# Patient Record
Sex: Female | Born: 1980 | Race: Black or African American | Hispanic: No | Marital: Single | Smoking: Current every day smoker
Health system: Southern US, Community
[De-identification: ages and names within clinical notes are randomized; demographics above are authoritative.]

## PROBLEM LIST (undated history)

## (undated) DIAGNOSIS — Z789 Other specified health status: Secondary | ICD-10-CM

## (undated) DIAGNOSIS — D509 Iron deficiency anemia, unspecified: Secondary | ICD-10-CM

## (undated) DIAGNOSIS — Z87442 Personal history of urinary calculi: Secondary | ICD-10-CM

## (undated) HISTORY — PX: NO PAST SURGERIES: SHX2092

---
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
## 2001-07-07 ENCOUNTER — Inpatient Hospital Stay (HOSPITAL_COMMUNITY): Admission: AD | Admit: 2001-07-07 | Discharge: 2001-07-07 | Payer: Self-pay | Admitting: *Deleted

## 2001-07-09 ENCOUNTER — Inpatient Hospital Stay (HOSPITAL_COMMUNITY): Admission: AD | Admit: 2001-07-09 | Discharge: 2001-07-09 | Payer: Self-pay | Admitting: *Deleted

## 2001-12-20 ENCOUNTER — Emergency Department (HOSPITAL_COMMUNITY): Admission: EM | Admit: 2001-12-20 | Discharge: 2001-12-20 | Payer: Self-pay | Admitting: Emergency Medicine

## 2002-10-19 ENCOUNTER — Inpatient Hospital Stay (HOSPITAL_COMMUNITY): Admission: AD | Admit: 2002-10-19 | Discharge: 2002-10-19 | Payer: Self-pay | Admitting: Obstetrics

## 2002-12-22 ENCOUNTER — Inpatient Hospital Stay (HOSPITAL_COMMUNITY): Admission: AD | Admit: 2002-12-22 | Discharge: 2002-12-22 | Payer: Self-pay | Admitting: Obstetrics

## 2003-08-08 ENCOUNTER — Inpatient Hospital Stay (HOSPITAL_COMMUNITY): Admission: AD | Admit: 2003-08-08 | Discharge: 2003-08-08 | Payer: Self-pay | Admitting: Obstetrics and Gynecology

## 2003-12-11 ENCOUNTER — Emergency Department (HOSPITAL_COMMUNITY): Admission: EM | Admit: 2003-12-11 | Discharge: 2003-12-11 | Payer: Self-pay | Admitting: Emergency Medicine

## 2005-01-12 ENCOUNTER — Inpatient Hospital Stay (HOSPITAL_COMMUNITY): Admission: AD | Admit: 2005-01-12 | Discharge: 2005-01-12 | Payer: Self-pay | Admitting: Obstetrics

## 2005-06-27 ENCOUNTER — Inpatient Hospital Stay (HOSPITAL_COMMUNITY): Admission: AD | Admit: 2005-06-27 | Discharge: 2005-06-27 | Payer: Self-pay | Admitting: Obstetrics

## 2005-10-29 ENCOUNTER — Emergency Department (HOSPITAL_COMMUNITY): Admission: EM | Admit: 2005-10-29 | Discharge: 2005-10-29 | Payer: Self-pay | Admitting: Emergency Medicine

## 2005-10-31 ENCOUNTER — Emergency Department (HOSPITAL_COMMUNITY): Admission: EM | Admit: 2005-10-31 | Discharge: 2005-10-31 | Payer: Self-pay | Admitting: Emergency Medicine

## 2006-08-14 ENCOUNTER — Emergency Department (HOSPITAL_COMMUNITY): Admission: EM | Admit: 2006-08-14 | Discharge: 2006-08-15 | Payer: Self-pay | Admitting: Emergency Medicine

## 2006-08-17 ENCOUNTER — Emergency Department (HOSPITAL_COMMUNITY): Admission: EM | Admit: 2006-08-17 | Discharge: 2006-08-17 | Payer: Self-pay | Admitting: Emergency Medicine

## 2006-09-10 ENCOUNTER — Emergency Department (HOSPITAL_COMMUNITY): Admission: EM | Admit: 2006-09-10 | Discharge: 2006-09-10 | Payer: Self-pay | Admitting: Emergency Medicine

## 2006-11-29 ENCOUNTER — Emergency Department (HOSPITAL_COMMUNITY): Admission: EM | Admit: 2006-11-29 | Discharge: 2006-11-29 | Payer: Self-pay | Admitting: *Deleted

## 2008-01-26 ENCOUNTER — Emergency Department (HOSPITAL_COMMUNITY): Admission: EM | Admit: 2008-01-26 | Discharge: 2008-01-27 | Payer: Self-pay | Admitting: Emergency Medicine

## 2010-11-02 ENCOUNTER — Emergency Department (HOSPITAL_COMMUNITY)
Admission: EM | Admit: 2010-11-02 | Discharge: 2010-11-03 | Disposition: A | Discharge status: Home / Self Care | Payer: No Typology Code available for payment source | Attending: Emergency Medicine | Admitting: Emergency Medicine

## 2010-11-02 ENCOUNTER — Emergency Department (HOSPITAL_COMMUNITY): Payer: No Typology Code available for payment source

## 2010-11-02 DIAGNOSIS — T148XXA Other injury of unspecified body region, initial encounter: Secondary | ICD-10-CM | POA: Insufficient documentation

## 2010-11-02 DIAGNOSIS — M542 Cervicalgia: Secondary | ICD-10-CM | POA: Insufficient documentation

## 2010-11-02 DIAGNOSIS — M25519 Pain in unspecified shoulder: Secondary | ICD-10-CM | POA: Insufficient documentation

## 2010-11-02 DIAGNOSIS — M79609 Pain in unspecified limb: Secondary | ICD-10-CM | POA: Insufficient documentation

## 2010-11-14 LAB — COMPREHENSIVE METABOLIC PANEL
CO2: 24 mEq/L (ref 19–32)
Calcium: 8.7 mg/dL (ref 8.4–10.5)
Creatinine, Ser: 0.85 mg/dL (ref 0.4–1.2)
GFR calc non Af Amer: >60 mL/min (ref >60)
Glucose, Bld: 99 mg/dL (ref 70–99)

## 2010-11-14 LAB — DIFFERENTIAL
Eosinophils Absolute: 0.2 10*3/uL (ref 0.0–0.7)
Lymphocytes Relative: 32 % (ref 12–46)
Lymphs Abs: 1.5 10*3/uL (ref 0.7–4.0)
Neutrophils Relative %: 55 % (ref 43–77)

## 2010-11-14 LAB — CBC
HCT: 37.7 % (ref 36.0–46.0)
Hemoglobin: 12.2 g/dL (ref 12.0–15.0)
MCHC: 32.5 g/dL (ref 30.0–36.0)
MCV: 80 fL (ref 78.0–100.0)
Platelets: 249 10*3/uL (ref 150–400)
RBC: 4.71 MIL/uL (ref 3.87–5.11)
RDW: 14.5 % (ref 11.5–15.5)
WBC: 4.9 10*3/uL (ref 4.0–10.5)

## 2010-11-14 LAB — URINALYSIS, ROUTINE W REFLEX MICROSCOPIC
Ketones, ur: 15 mg/dL — AB
Leukocytes, UA: NEGATIVE
Nitrite: POSITIVE — AB
Specific Gravity, Urine: 1.027 (ref 1.005–1.030)
Urobilinogen, UA: 0.2 mg/dL (ref 0.0–1.0)
pH: 5.5 (ref 5.0–8.0)

## 2010-11-14 LAB — WET PREP, GENITAL: Trich, Wet Prep: NONE SEEN

## 2010-11-14 LAB — PREGNANCY, URINE: Preg Test, Ur: NEGATIVE

## 2010-11-14 LAB — URINE MICROSCOPIC-ADD ON

## 2010-11-19 LAB — URINALYSIS, ROUTINE W REFLEX MICROSCOPIC
Glucose, UA: NEGATIVE
Hgb urine dipstick: NEGATIVE
Specific Gravity, Urine: 1.026

## 2010-11-19 LAB — DIFFERENTIAL
Lymphocytes Relative: 14
Lymphs Abs: 1.2
Monocytes Absolute: 0.3
Monocytes Relative: 4
Neutro Abs: 6.7
Neutrophils Relative %: 81 — ABNORMAL HIGH

## 2010-11-19 LAB — BASIC METABOLIC PANEL
CO2: 29
Calcium: 9.8
GFR calc Af Amer: >60
GFR calc non Af Amer: >60
Sodium: 140

## 2010-11-19 LAB — URINE MICROSCOPIC-ADD ON

## 2010-11-19 LAB — CBC
Hemoglobin: 12.6
RBC: 4.77
WBC: 8.4

## 2010-11-24 LAB — URINALYSIS, ROUTINE W REFLEX MICROSCOPIC
Bilirubin Urine: NEGATIVE
Glucose, UA: NEGATIVE
Ketones, ur: NEGATIVE
Nitrite: NEGATIVE
Protein, ur: NEGATIVE

## 2010-11-24 LAB — PREGNANCY, URINE: Preg Test, Ur: NEGATIVE

## 2010-11-25 LAB — URINALYSIS, ROUTINE W REFLEX MICROSCOPIC
Ketones, ur: NEGATIVE
Nitrite: POSITIVE — AB
Specific Gravity, Urine: 1.015
pH: 7

## 2010-11-25 LAB — URINE CULTURE: Colony Count: >=100000

## 2010-11-25 LAB — URINE MICROSCOPIC-ADD ON

## 2013-08-17 ENCOUNTER — Encounter (HOSPITAL_COMMUNITY): Payer: Self-pay | Admitting: *Deleted

## 2013-08-17 ENCOUNTER — Inpatient Hospital Stay (HOSPITAL_COMMUNITY)
Admission: AD | Admit: 2013-08-17 | Discharge: 2013-08-17 | Disposition: A | Discharge status: Home / Self Care | Payer: Medicaid Other | Source: Ambulatory Visit | Attending: Obstetrics | Admitting: Obstetrics

## 2013-08-17 DIAGNOSIS — F17200 Nicotine dependence, unspecified, uncomplicated: Secondary | ICD-10-CM | POA: Insufficient documentation

## 2013-08-17 DIAGNOSIS — R109 Unspecified abdominal pain: Secondary | ICD-10-CM | POA: Insufficient documentation

## 2013-08-17 DIAGNOSIS — N390 Urinary tract infection, site not specified: Secondary | ICD-10-CM | POA: Diagnosis not present

## 2013-08-17 DIAGNOSIS — L293 Anogenital pruritus, unspecified: Secondary | ICD-10-CM | POA: Diagnosis not present

## 2013-08-17 DIAGNOSIS — A599 Trichomoniasis, unspecified: Secondary | ICD-10-CM

## 2013-08-17 DIAGNOSIS — A5901 Trichomonal vulvovaginitis: Secondary | ICD-10-CM | POA: Insufficient documentation

## 2013-08-17 HISTORY — DX: Other specified health status: Z78.9

## 2013-08-17 LAB — URINE MICROSCOPIC-ADD ON

## 2013-08-17 LAB — URINALYSIS, ROUTINE W REFLEX MICROSCOPIC
Bilirubin Urine: NEGATIVE
Glucose, UA: NEGATIVE mg/dL
Hgb urine dipstick: NEGATIVE
Ketones, ur: NEGATIVE mg/dL
Nitrite: POSITIVE — AB
PROTEIN: NEGATIVE mg/dL
Specific Gravity, Urine: 1.015 (ref 1.005–1.030)
UROBILINOGEN UA: 0.2 mg/dL (ref 0.0–1.0)
pH: 6.5 (ref 5.0–8.0)

## 2013-08-17 LAB — WET PREP, GENITAL
CLUE CELLS WET PREP: NONE SEEN
Yeast Wet Prep HPF POC: NONE SEEN

## 2013-08-17 LAB — POCT PREGNANCY, URINE: PREG TEST UR: NEGATIVE

## 2013-08-17 MED ORDER — SULFAMETHOXAZOLE-TMP DS 800-160 MG PO TABS: 1.0000 | ORAL_TABLET | Freq: Two times a day (BID) | ORAL | Status: DC

## 2013-08-17 MED ORDER — METRONIDAZOLE 500 MG PO TABS: ORAL_TABLET | ORAL | Status: DC

## 2013-08-17 NOTE — MAU Note (Addendum)
Mild abd pain, and irritation in vagina-externa- is not painfull. Mild tingly sensation where urine comes out.  They symptoms for about a wk.

## 2013-08-17 NOTE — MAU Provider Note (Signed)
History     CSN: [id number]  Arrival date and time: 08/17/13 1150   None     Chief Complaint  Patient presents with  . Abdominal Pain  . vag irritation    HPI This is a 33 y.o. female who presents with c/o lower abdominal cramping and vaginal irritation. Some dysuria.  RN Note: Mild abd pain, and irritation in vagina-externa- is not painfull. Mild tingly sensation where urine comes out. They symptoms for about a wk.       OB History   Grav Para Term Preterm Abortions TAB SAB Ect Mult Living   1 1 1       1       Past Medical History  Diagnosis Date  . Medical history non-contributory     Past Surgical History  Procedure Laterality Date  . No past surgeries      History reviewed. No pertinent family history.  History  Substance Use Topics  . Smoking status: Current Every Day Smoker -- 0.50 packs/day    Types: Cigarettes  . Smokeless tobacco: Not on file  . Alcohol Use: No    Allergies: No Known Allergies  No prescriptions prior to admission    Review of Systems  Constitutional: Negative for fever, chills and malaise/fatigue.  Gastrointestinal: Positive for abdominal pain (slight cramping). Negative for nausea and vomiting.  Genitourinary: Positive for dysuria.       Vag irritation   Physical Exam   Blood pressure 132/75, pulse 88, temperature 98.7 F (37.1 C), temperature source Oral, resp. rate 18, height 5' 4.5" (1.638 m), weight 120.203 kg (265 lb), last menstrual period 08/01/2013.  Physical Exam  Constitutional: She is oriented to person, place, and time. She appears well-developed and well-nourished. No distress.  HENT:  Head: Normocephalic.  Cardiovascular: Normal rate.   Respiratory: Effort normal.  GI: Soft. She exhibits no distension. There is no tenderness. There is no rebound and no guarding.  Genitourinary: Uterus normal. Vaginal discharge (thick white) found.  Musculoskeletal: Normal range of motion.  Neurological: She is alert and  oriented to person, place, and time.  Skin: Skin is warm and dry.  Psychiatric: She has a normal mood and affect.    MAU Course  Procedures  MDM Results for orders placed during the hospital encounter of 08/17/13 (from the past 24 hour(s))  URINALYSIS, ROUTINE W REFLEX MICROSCOPIC     Status: Abnormal   Collection Time    08/17/13 12:05 PM      Result Value Ref Range   Color, Urine YELLOW  YELLOW   APPearance HAZY (*) CLEAR   Specific Gravity, Urine 1.015  1.005 - 1.030   pH 6.5  5.0 - 8.0   Glucose, UA NEGATIVE  NEGATIVE mg/dL   Hgb urine dipstick NEGATIVE  NEGATIVE   Bilirubin Urine NEGATIVE  NEGATIVE   Ketones, ur NEGATIVE  NEGATIVE mg/dL   Protein, ur NEGATIVE  NEGATIVE mg/dL   Urobilinogen, UA 0.2  0.0 - 1.0 mg/dL   Nitrite POSITIVE (*) NEGATIVE   Leukocytes, UA MODERATE (*) NEGATIVE  URINE MICROSCOPIC-ADD ON     Status: Abnormal   Collection Time    08/17/13 12:05 PM      Result Value Ref Range   Squamous Epithelial / LPF RARE  RARE   WBC, UA 7-10  <3 WBC/hpf   RBC / HPF 0-2  <3 RBC/hpf   Bacteria, UA MANY (*) RARE   Urine-Other MUCOUS PRESENT    POCT PREGNANCY, URINE  Status: None   Collection Time    08/17/13 12:09 PM      Result Value Ref Range   Preg Test, Ur NEGATIVE  NEGATIVE  WET PREP, GENITAL     Status: Abnormal   Collection Time    08/17/13  1:15 PM      Result Value Ref Range   Yeast Wet Prep HPF POC NONE SEEN  NONE SEEN   Trich, Wet Prep MODERATE (*) NONE SEEN   Clue Cells Wet Prep HPF POC NONE SEEN  NONE SEEN   WBC, Wet Prep HPF POC FEW (*) NONE SEEN     Assessment and Plan  A:  Trichomonas infection       UTI  P:  DIscharge home       Discussed results and treatment plans, encouraged to have partner treated          Medication List         ibuprofen 800 MG tablet  Commonly known as:  ADVIL,MOTRIN  Take 800 mg by mouth every 8 (eight) hours as needed.     metroNIDAZOLE 500 MG tablet  Commonly known as:  FLAGYL  Take two tablets  by mouth twice a day, for one day.  Or you can take all four tablets at once if you can tolerate it.     sulfamethoxazole-trimethoprim 800-160 MG per tablet  Commonly known as:  BACTRIM DS  Take 1 tablet by mouth 2 (two) times daily.         Wynelle Bourgeois 08/17/2013, 1:04 PM

## 2013-08-17 NOTE — Discharge Instructions (Signed)
Urinary Tract Infection °Urinary tract infections (UTIs) can develop anywhere along your urinary tract. Your urinary tract is your body's drainage system for removing wastes and extra water. Your urinary tract includes two kidneys, two ureters, a bladder, and a urethra. Your kidneys are a pair of bean-shaped organs. Each kidney is about the size of your fist. They are located below your ribs, one on each side of your spine. °CAUSES °Infections are caused by microbes, which are microscopic organisms, including fungi, viruses, and bacteria. These organisms are so small that they can only be seen through a microscope. Bacteria are the microbes that most commonly cause UTIs. °SYMPTOMS  °Symptoms of UTIs may vary by age and gender of the patient and by the location of the infection. Symptoms in young women typically include a frequent and intense urge to urinate and a painful, burning feeling in the bladder or urethra during urination. Older women and men are more likely to be tired, shaky, and weak and have muscle aches and abdominal pain. A fever may mean the infection is in your kidneys. Other symptoms of a kidney infection include pain in your back or sides below the ribs, nausea, and vomiting. °DIAGNOSIS °To diagnose a UTI, your caregiver will ask you about your symptoms. Your caregiver also will ask to provide a urine sample. The urine sample will be tested for bacteria and white blood cells. White blood cells are made by your body to help fight infection. °TREATMENT  °Typically, UTIs can be treated with medication. Because most UTIs are caused by a bacterial infection, they usually can be treated with the use of antibiotics. The choice of antibiotic and length of treatment depend on your symptoms and the type of bacteria causing your infection. °HOME CARE INSTRUCTIONS °· If you were prescribed antibiotics, take them exactly as your caregiver instructs you. Finish the medication even if you feel better after you  have only taken some of the medication. °· Drink enough water and fluids to keep your urine clear or pale yellow. °· Avoid caffeine, tea, and carbonated beverages. They tend to irritate your bladder. °· Empty your bladder often. Avoid holding urine for long periods of time. °· Empty your bladder before and after sexual intercourse. °· After a bowel movement, women should cleanse from front to back. Use each tissue only once. °SEEK MEDICAL CARE IF:  °· You have back pain. °· You develop a fever. °· Your symptoms do not begin to resolve within 3 days. °SEEK IMMEDIATE MEDICAL CARE IF:  °· You have severe back pain or lower abdominal pain. °· You develop chills. °· You have nausea or vomiting. °· You have continued burning or discomfort with urination. °MAKE SURE YOU:  °· Understand these instructions. °· Will watch your condition. °· Will get help right away if you are not doing well or get worse. °Document Released: 11/05/2004 Document Revised: 07/28/2011 Document Reviewed: 03/06/2011 °ExitCare® Patient Information ©2015 [employer]. This information is not intended to replace advice given to you by your health care provider. Make sure you discuss any questions you have with your health care provider. °Trichomoniasis °Trichomoniasis is an infection caused by an organism called Trichomonas. The infection can affect both women and men. In women, the outer female genitalia and the vagina are affected. In men, the penis is mainly affected, but the prostate and other reproductive organs can also be involved. Trichomoniasis is a sexually transmitted infection (STI) and is most often passed to another person through sexual contact.  °  RISK FACTORS °· Having unprotected sexual intercourse. °· Having sexual intercourse with an infected partner. °SIGNS AND SYMPTOMS  °Symptoms of trichomoniasis in women include: °· Abnormal gray-green frothy vaginal discharge. °· Itching and irritation of the vagina. °· Itching and irritation  of the area outside the vagina. °Symptoms of trichomoniasis in men include:  °· Penile discharge with or without pain. °· Pain during urination. This results from inflammation of the urethra. °DIAGNOSIS  °Trichomoniasis may be found during a Pap test or physical exam. Your health care provider may use one of the following methods to help diagnose this infection: °· Examining vaginal discharge under a microscope. For men, urethral discharge would be examined. °· Testing the pH of the vagina with a test tape. °· Using a vaginal swab test that checks for the Trichomonas organism. A test is available that provides results within a few minutes. °· Doing a culture test for the organism. This is not usually needed. °TREATMENT  °· You may be given medicine to fight the infection. Women should inform their health care provider if they could be or are pregnant. Some medicines used to treat the infection should not be taken during pregnancy. °· Your health care provider may recommend over-the-counter medicines or creams to decrease itching or irritation. °· Your sexual partner will need to be treated if infected. °HOME CARE INSTRUCTIONS  °· Take all medicine prescribed by your health care provider. °· Take over-the-counter medicine for itching or irritation as directed by your health care provider. °· Do not have sexual intercourse while you have the infection. °· Women should not douche or wear tampons while they have the infection. °· Discuss your infection with your partner. Your partner may have gotten the infection from you, or you may have gotten it from your partner. °· Have your sex partner get examined and treated if necessary. °· Practice safe, informed, and protected sex. °· See your health care provider for other STI testing. °SEEK MEDICAL CARE IF:  °· You still have symptoms after you finish your medicine. °· You develop abdominal pain. °· You have pain when you urinate. °· You have bleeding after sexual  intercourse. °· You develop a rash. °· Your medicine makes you sick or makes you throw up (vomit). °Document Released: 07/22/2000 Document Revised: 01/31/2013 Document Reviewed: 11/07/2012 °ExitCare® Patient Information ©2015 [employer]. This information is not intended to replace advice given to you by your health care provider. Make sure you discuss any questions you have with your health care provider. ° °

## 2013-08-18 LAB — GC/CHLAMYDIA PROBE AMP
CT PROBE, AMP APTIMA: NEGATIVE
GC PROBE AMP APTIMA: NEGATIVE

## 2013-08-18 NOTE — MAU Provider Note (Signed)
Attestation of Attending Supervision of Advanced Practitioner (PA/CNM/NP): Evaluation and management procedures were performed by the Advanced Practitioner under my supervision and collaboration.  I have reviewed the Advanced Practitioner's note and chart, and I agree with the management and plan.  Natayla Cadenhead, MD, FACOG Attending Obstetrician & Gynecologist Faculty Practice, [facility]   

## 2013-08-20 LAB — URINE CULTURE
Colony Count: >=100000
Special Requests: NORMAL

## 2013-12-11 ENCOUNTER — Encounter (HOSPITAL_COMMUNITY): Payer: Self-pay | Admitting: *Deleted

## 2014-04-26 ENCOUNTER — Encounter (HOSPITAL_COMMUNITY): Payer: Self-pay

## 2014-04-26 ENCOUNTER — Emergency Department (HOSPITAL_COMMUNITY)
Admission: EM | Admit: 2014-04-26 | Discharge: 2014-04-26 | Discharge status: Against Medical Advice | Payer: Medicaid Other | Attending: Emergency Medicine | Admitting: Emergency Medicine

## 2014-04-26 DIAGNOSIS — Z792 Long term (current) use of antibiotics: Secondary | ICD-10-CM | POA: Insufficient documentation

## 2014-04-26 DIAGNOSIS — R52 Pain, unspecified: Secondary | ICD-10-CM | POA: Insufficient documentation

## 2014-04-26 DIAGNOSIS — Z720 Tobacco use: Secondary | ICD-10-CM | POA: Insufficient documentation

## 2014-04-26 NOTE — ED Provider Notes (Signed)
CSN: [id number]     Arrival date & time 04/26/14  [id number] History   First MD Initiated Contact with Patient 04/26/14 1017     Chief Complaint  Patient presents with  . Headache     HPI Ms. Cherre HugerMack presents for evaluation of headache, right side neck pain, right leg pain.    Past Medical History  Diagnosis Date  . Medical history non-contributory    Past Surgical History  Procedure Laterality Date  . No past surgeries     No family history on file. History  Substance Use Topics  . Smoking status: Current Some Day Smoker -- 0.50 packs/day    Types: Cigarettes  . Smokeless tobacco: Not on file  . Alcohol Use: No   OB History    Gravida Para Term Preterm AB TAB SAB Ectopic Multiple Living   1 1 1       1      Review of Systems    Allergies  Review of patient's allergies indicates no known allergies.  Home Medications   Prior to Admission medications   Medication Sig Start Date End Date Taking? Authorizing Provider  aspirin-acetaminophen-caffeine (EXCEDRIN MIGRAINE) [phone] MG per tablet Take 2 tablets by mouth every 6 (six) hours as needed for headache or migraine.   Yes Historical Provider, MD  ibuprofen (ADVIL,MOTRIN) 200 MG tablet Take 200 mg by mouth every 6 (six) hours as needed for fever, headache, moderate pain or cramping.   Yes Historical Provider, MD  methocarbamol (ROBAXIN) 750 MG tablet Take 750 mg by mouth every 8 (eight) hours as needed for muscle spasms.   Yes Historical Provider, MD  metroNIDAZOLE (FLAGYL) 500 MG tablet Take two tablets by mouth twice a day, for one day.  Or you can take all four tablets at once if you can tolerate it. Patient not taking: Reported on 04/26/2014 08/17/13   Aviva SignsMarie L Williams, CNM  sulfamethoxazole-trimethoprim (BACTRIM DS) 800-160 MG per tablet Take 1 tablet by mouth 2 (two) times daily. Patient not taking: Reported on 04/26/2014 08/17/13   Aviva SignsMarie L Williams, CNM   BP 114/75 mmHg  Pulse 75  Temp(Src) 98 F (36.7 C) (Oral)  Resp 16   Ht 5\' 4"  (1.626 m)  Wt 273 lb (123.832 kg)  BMI 46.84 kg/m2  SpO2 100%  LMP 04/20/2014 Physical Exam  ED Course  Procedures (including critical care time) Labs Review Labs Reviewed - No data to display  Imaging Review No results found.   EKG Interpretation None      MDM   Final diagnoses:  Pain    Pt eloped from department prior to complete history and physical exam.     Tilden FossaElizabeth Ryley Bachtel, MD 04/26/14 1335

## 2014-04-26 NOTE — ED Notes (Signed)
Pt reports right side h/a and neck pain since Saturday.  Pt reports nausea and not been able to eat since Saturday.   Pt reports right lower back pain that intermittently radiates to right leg.  Pt has taken iboprofen, TheraFlu, a muscle relaxer (robaxcin) without any desired effect.  Pt has not experienced this pain before.

## 2014-04-26 NOTE — ED Notes (Signed)
I tried to stop patient twice from leaving. She seemed very upset due to doctor not coming in time, she kept stating she was in pain.

## 2014-04-26 NOTE — ED Notes (Signed)
Patient tearful. Explained that EDNT was notifying EDP. Patient sat on the edge of the bed.

## 2014-04-26 NOTE — ED Notes (Signed)
Patient was gone from room. EDNT stated she saw the patient leave a second time.

## 2014-05-15 ENCOUNTER — Emergency Department (HOSPITAL_COMMUNITY)
Admission: EM | Admit: 2014-05-15 | Discharge: 2014-05-15 | Disposition: A | Discharge status: Home / Self Care | Payer: No Typology Code available for payment source | Attending: Emergency Medicine | Admitting: Emergency Medicine

## 2014-05-15 ENCOUNTER — Encounter (HOSPITAL_COMMUNITY): Payer: Self-pay

## 2014-05-15 DIAGNOSIS — M5430 Sciatica, unspecified side: Secondary | ICD-10-CM | POA: Diagnosis not present

## 2014-05-15 DIAGNOSIS — Z792 Long term (current) use of antibiotics: Secondary | ICD-10-CM | POA: Diagnosis not present

## 2014-05-15 DIAGNOSIS — Y9389 Activity, other specified: Secondary | ICD-10-CM | POA: Insufficient documentation

## 2014-05-15 DIAGNOSIS — Z720 Tobacco use: Secondary | ICD-10-CM | POA: Insufficient documentation

## 2014-05-15 DIAGNOSIS — Z79899 Other long term (current) drug therapy: Secondary | ICD-10-CM | POA: Insufficient documentation

## 2014-05-15 DIAGNOSIS — S3992XA Unspecified injury of lower back, initial encounter: Secondary | ICD-10-CM | POA: Insufficient documentation

## 2014-05-15 DIAGNOSIS — Y92410 Unspecified street and highway as the place of occurrence of the external cause: Secondary | ICD-10-CM | POA: Diagnosis not present

## 2014-05-15 DIAGNOSIS — M545 Low back pain: Secondary | ICD-10-CM

## 2014-05-15 DIAGNOSIS — Y998 Other external cause status: Secondary | ICD-10-CM | POA: Diagnosis not present

## 2014-05-15 MED ORDER — CYCLOBENZAPRINE HCL 10 MG PO TABS: 10.0000 mg | ORAL_TABLET | Freq: Two times a day (BID) | ORAL | Status: DC | PRN

## 2014-05-15 MED ORDER — KETOROLAC TROMETHAMINE 60 MG/2ML IM SOLN
60.0000 mg | Freq: Once | INTRAMUSCULAR | Status: AC
  Administered 2014-05-15: 60 mg via INTRAMUSCULAR
  Filled 2014-05-15: qty 2
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
## 2014-05-15 NOTE — Discharge Instructions (Signed)
Return to the emergency room with worsening of symptoms, new symptoms or with symptoms that are concerning , especially fevers, loss of control of bladder or bowels, numbness or tingling around genital region or anus, weakness. RICE: Rest, Ice (three cycles of 20 mins on, 20mins off at least twice a day), compression/brace, elevation. Heating pad works well for back pain. Ibuprofen 400mg  (2 tablets 200mg ) every 5-6 hours for 3-5 days. Flexeril for severe pain. Do not operate machinery, drive or drink alcohol while taking narcotics or muscle relaxers. Follow up with PCP/orthopedist if symptoms worsen or are persistent. Read below information and follow recommendations.  Back Exercises Back exercises help treat and prevent back injuries. The goal of back exercises is to increase the strength of your abdominal and back muscles and the flexibility of your back. These exercises should be started when you no longer have back pain. Back exercises include:  Pelvic Tilt. Lie on your back with your knees bent. Tilt your pelvis until the lower part of your back is against the floor. Hold this position 5 to 10 sec and repeat 5 to 10 times.  Knee to Chest. Pull first 1 knee up against your chest and hold for 20 to 30 seconds, repeat this with the other knee, and then both knees. This may be done with the other leg straight or bent, whichever feels better.  Sit-Ups or Curl-Ups. Bend your knees 90 degrees. Start with tilting your pelvis, and do a partial, slow sit-up, lifting your trunk only 30 to 45 degrees off the floor. Take at least 2 to 3 seconds for each sit-up. Do not do sit-ups with your knees out straight. If partial sit-ups are difficult, simply do the above but with only tightening your abdominal muscles and holding it as directed.  Hip-Lift. Lie on your back with your knees flexed 90 degrees. Push down with your feet and shoulders as you raise your hips a couple inches off the floor; hold for 10  seconds, repeat 5 to 10 times.  Back arches. Lie on your stomach, propping yourself up on bent elbows. Slowly press on your hands, causing an arch in your low back. Repeat 3 to 5 times. Any initial stiffness and discomfort should lessen with repetition over time.  Shoulder-Lifts. Lie face down with arms beside your body. Keep hips and torso pressed to floor as you slowly lift your head and shoulders off the floor. Do not overdo your exercises, especially in the beginning. Exercises may cause you some mild back discomfort which lasts for a few minutes; however, if the pain is more severe, or lasts for more than 15 minutes, do not continue exercises until you see your caregiver. Improvement with exercise therapy for back problems is slow.  See your caregivers for assistance with developing a proper back exercise program. Document Released: 03/05/2004 Document Revised: 04/20/2011 Document Reviewed: 11/27/2010 [facility] Patient Information 2015 [employer] LakesExitCare, [place]. This information is not intended to replace advice given to you by your health care provider. Make sure you discuss any questions you have with your health care provider.
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
## 2014-11-28 ENCOUNTER — Emergency Department (HOSPITAL_COMMUNITY): Payer: Medicaid Other

## 2014-11-28 ENCOUNTER — Emergency Department (HOSPITAL_COMMUNITY)
Admission: EM | Admit: 2014-11-28 | Discharge: 2014-11-29 | Disposition: A | Discharge status: Home / Self Care | Payer: Medicaid Other | Attending: Emergency Medicine | Admitting: Emergency Medicine

## 2014-11-28 ENCOUNTER — Encounter (HOSPITAL_COMMUNITY): Payer: Self-pay

## 2014-11-28 DIAGNOSIS — R519 Headache, unspecified: Secondary | ICD-10-CM

## 2014-11-28 DIAGNOSIS — R51 Headache: Secondary | ICD-10-CM | POA: Diagnosis not present

## 2014-11-28 DIAGNOSIS — N12 Tubulo-interstitial nephritis, not specified as acute or chronic: Secondary | ICD-10-CM | POA: Diagnosis not present

## 2014-11-28 DIAGNOSIS — Z720 Tobacco use: Secondary | ICD-10-CM | POA: Diagnosis not present

## 2014-11-28 DIAGNOSIS — R079 Chest pain, unspecified: Secondary | ICD-10-CM

## 2014-11-28 LAB — CBC WITH DIFFERENTIAL/PLATELET
Basophils Absolute: 0 10*3/uL (ref 0.0–0.1)
Basophils Relative: 0 %
EOS ABS: 0.1 10*3/uL (ref 0.0–0.7)
Eosinophils Relative: 1 %
HCT: 28.1 % — ABNORMAL LOW (ref 36.0–46.0)
Hemoglobin: 8.7 g/dL — ABNORMAL LOW (ref 12.0–15.0)
LYMPHS ABS: 0.8 10*3/uL (ref 0.7–4.0)
LYMPHS PCT: 14 %
MCH: 22.4 pg — ABNORMAL LOW (ref 26.0–34.0)
MCHC: 31 g/dL (ref 30.0–36.0)
MCV: 72.2 fL — AB (ref 78.0–100.0)
MONOS PCT: 12 %
Monocytes Absolute: 0.7 10*3/uL (ref 0.1–1.0)
Neutro Abs: 4.5 10*3/uL (ref 1.7–7.7)
Neutrophils Relative %: 73 %
PLATELETS: 247 10*3/uL (ref 150–400)
RBC: 3.89 MIL/uL (ref 3.87–5.11)
RDW: 17.5 % — ABNORMAL HIGH (ref 11.5–15.5)
WBC: 6.2 10*3/uL (ref 4.0–10.5)

## 2014-11-28 LAB — COMPREHENSIVE METABOLIC PANEL
ALT: 13 U/L — AB (ref 14–54)
AST: 28 U/L (ref 15–41)
Albumin: 3.6 g/dL (ref 3.5–5.0)
Alkaline Phosphatase: 87 U/L (ref 38–126)
Anion gap: 7 (ref 5–15)
BILIRUBIN TOTAL: 0.4 mg/dL (ref 0.3–1.2)
BUN: 5 mg/dL — ABNORMAL LOW (ref 6–20)
CO2: 25 mmol/L (ref 22–32)
CREATININE: 0.67 mg/dL (ref 0.44–1.00)
Calcium: 8.9 mg/dL (ref 8.9–10.3)
Chloride: 105 mmol/L (ref 101–111)
Glucose, Bld: 101 mg/dL — ABNORMAL HIGH (ref 65–99)
Potassium: 3.1 mmol/L — ABNORMAL LOW (ref 3.5–5.1)
Sodium: 137 mmol/L (ref 135–145)
Total Protein: 7.2 g/dL (ref 6.5–8.1)

## 2014-11-28 LAB — URINALYSIS, ROUTINE W REFLEX MICROSCOPIC
BILIRUBIN URINE: NEGATIVE
Glucose, UA: NEGATIVE mg/dL
HGB URINE DIPSTICK: NEGATIVE
Ketones, ur: NEGATIVE mg/dL
Nitrite: POSITIVE — AB
PH: 7.5 (ref 5.0–8.0)
Protein, ur: NEGATIVE mg/dL
SPECIFIC GRAVITY, URINE: 1.017 (ref 1.005–1.030)
Urobilinogen, UA: 1 mg/dL (ref 0.0–1.0)

## 2014-11-28 LAB — URINE MICROSCOPIC-ADD ON

## 2014-11-28 MED ORDER — ACETAMINOPHEN 325 MG PO TABS: 650.0000 mg | ORAL_TABLET | Freq: Once | ORAL | Status: DC

## 2014-11-28 MED ORDER — ACETAMINOPHEN 325 MG PO TABS
650.0000 mg | ORAL_TABLET | Freq: Once | ORAL | Status: AC | PRN
  Administered 2014-11-28: 650 mg via ORAL
  Filled 2014-11-28: qty 2

## 2014-11-28 MED ORDER — POTASSIUM CHLORIDE CRYS ER 20 MEQ PO TBCR
40.0000 meq | EXTENDED_RELEASE_TABLET | Freq: Once | ORAL | Status: AC
  Administered 2014-11-28: 40 meq via ORAL
  Filled 2014-11-28: qty 2

## 2014-11-28 MED ORDER — IBUPROFEN 200 MG PO TABS
600.0000 mg | ORAL_TABLET | Freq: Once | ORAL | Status: AC
  Administered 2014-11-28: 600 mg via ORAL
  Filled 2014-11-28: qty 3

## 2014-11-28 MED ORDER — SODIUM CHLORIDE 0.9 % IV BOLUS (SEPSIS)
1000.0000 mL | Freq: Once | INTRAVENOUS | Status: AC
  Administered 2014-11-28: 1000 mL via INTRAVENOUS

## 2014-11-28 MED ORDER — DEXTROSE 5 % IV SOLN
1.0000 g | Freq: Once | INTRAVENOUS | Status: AC
  Administered 2014-11-28: 1 g via INTRAVENOUS
  Filled 2014-11-28: qty 10

## 2014-11-28 MED ORDER — CEPHALEXIN 500 MG PO CAPS: 500.0000 mg | ORAL_CAPSULE | Freq: Three times a day (TID) | ORAL | Status: DC

## 2014-11-28 NOTE — ED Notes (Signed)
Nurse drawing labs. 

## 2014-11-28 NOTE — ED Notes (Signed)
Patient states she has had constant mid chest pain x 2 days. Patient also c/o frontal headache, and left lower back pain.Patient denies any dysuria or frequency.

## 2014-11-28 NOTE — ED Notes (Signed)
Patient transported to X-ray 

## 2014-11-28 NOTE — ED Provider Notes (Signed)
CSN: [id number]     Arrival date & time 11/28/14  1825 History   First MD Initiated Contact with Patient 11/28/14 1906     Chief Complaint  Patient presents with  . Chest Pain  . Back Pain  . Headache   HPI  Virginia Ferguson is a 34 year old female presenting with fevers, back pain, chest pain and headache. Pt states that she has chills and fevers that began 3 days ago. She is also complaining of left sided lumbar back pain that began at the same time as the fevers. The pain is constant, deep and aching. It is exacerbated by movement. Denies alleviating factors. She is also complaining of intermittent, sharp, shooting chest pains. She states that the chest pains feel like they're shooting up "from bottom to top of my chest". Pt indicates with her hand that the pain starts near the epigastrium and moves upward. The pain lasts 1-2 seconds. She denies exertional chest pain; states that it happens at random times. Denies lightheadedness, diaphoresis, nausea or SOB with the chest pains. Denies a personal or family cardiac history. She is also complaining of a frontal, throbbing headache for the past 2 days. She states that she has a personal headache history and this is typical of her headaches. She has not tried OTC pain relievers. Denies syncope, blurred vision, photophobia, phonophobia, cough, congestion, SOB, abdominal pain, vomiting, diarrhea, dysuria, vaginal discharge, weakness/numbness of the extremities or rashes. Pt states she has felt nauseated with fevers.   Past Medical History  Diagnosis Date  . Medical history non-contributory    Past Surgical History  Procedure Laterality Date  . No past surgeries     Family History  Problem Relation Age of Onset  . Hypertension Mother   . Diabetes Mother    Social History  Substance Use Topics  . Smoking status: Current Some Day Smoker -- 0.50 packs/day    Types: Cigarettes  . Smokeless tobacco: Never Used  . Alcohol Use: No   OB History     Gravida Para Term Preterm AB TAB SAB Ectopic Multiple Living   Review of Systems  Constitutional: Positive for fever and chills.  HENT: Negative for congestion and rhinorrhea.   Eyes: Negative for photophobia and visual disturbance.  Respiratory: Negative for cough and shortness of breath.   Cardiovascular: Positive for chest pain. Negative for palpitations.  Gastrointestinal: Positive for nausea. Negative for vomiting, abdominal pain and diarrhea.  Genitourinary: Positive for flank pain. Negative for dysuria, frequency and vaginal discharge.  Musculoskeletal: Positive for back pain.  Skin: Negative for rash.  Neurological: Positive for headaches. Negative for dizziness, syncope, weakness and numbness.      Allergies  Review of patient's allergies indicates no known allergies.  Home Medications   Prior to Admission medications   Medication Sig Start Date End Date Taking? Authorizing Provider  aspirin-acetaminophen-caffeine (EXCEDRIN MIGRAINE) [phone] MG per tablet Take 2 tablets by mouth every 6 (six) hours as needed for headache or migraine.   Yes Historical Provider, MD  cyclobenzaprine (FLEXERIL) 10 MG tablet Take 1 tablet (10 mg total) by mouth 2 (two) times daily as needed for muscle spasms. 05/15/14  Yes Oswaldo Conroy, PA-C  methocarbamol (ROBAXIN) 750 MG tablet Take 750 mg by mouth every 8 (eight) hours as needed for muscle spasms.   Yes Historical Provider, MD  cephALEXin (KEFLEX) 500 MG capsule Take 1 capsule (500 mg total)  by mouth 3 (three) times daily. 11/28/14   Odarius Dines, PA-C   BP 107/70 mmHg  Pulse 83  Temp(Src) 99.9 F (37.7 C) (Oral)  Resp 18  Ht 5\' 4"  (1.626 m)  Wt 250 lb (113.399 kg)  BMI 42.89 kg/m2  SpO2 99%  LMP 11/24/2014 Physical Exam  Constitutional: She is oriented to person, place, and time. She appears well-developed and well-nourished. She appears distressed (Tearful).  HENT:  Head: Normocephalic and atraumatic.   Mouth/Throat: Oropharynx is clear and moist. No oropharyngeal exudate.  Eyes: Conjunctivae and EOM are normal. Pupils are equal, round, and reactive to light. Right eye exhibits no discharge. Left eye exhibits no discharge. No scleral icterus.  Neck: Normal range of motion. Neck supple.  Cardiovascular: Normal rate, regular rhythm and normal heart sounds.   Pulmonary/Chest: Effort normal and breath sounds normal. No respiratory distress. She has no wheezes. She has no rales. She exhibits no tenderness.  Abdominal: Soft. Bowel sounds are normal. She exhibits no distension. There is no tenderness. There is CVA tenderness (left). There is no rebound and no guarding.  Musculoskeletal: Normal range of motion.  Pt moves all extremities spontaneously and walks with a steady gait  Neurological: She is alert and oriented to person, place, and time. No cranial nerve deficit. Coordination normal.  Cranial nerves 3-12 intact. Major muscle groups with 5/5 motor strength. Sensation to light touch intact. Coordinated finger to nose. Walks with a steady gait.   Skin: Skin is warm and dry. No rash noted.  Psychiatric: She has a normal mood and affect. Her behavior is normal.  Nursing note and vitals reviewed.   ED Course  Procedures (including critical care time) Labs Review Labs Reviewed  COMPREHENSIVE METABOLIC PANEL - Abnormal; Notable for the following:    Potassium 3.1 (*)    Glucose, Bld 101 (*)    BUN 5 (*)    ALT 13 (*)    All other components within normal limits  URINALYSIS, ROUTINE W REFLEX MICROSCOPIC (NOT AT [facility]) - Abnormal; Notable for the following:    APPearance CLOUDY (*)    Nitrite POSITIVE (*)    Leukocytes, UA SMALL (*)    All other components within normal limits  CBC WITH DIFFERENTIAL/PLATELET - Abnormal; Notable for the following:    Hemoglobin 8.7 (*)    HCT 28.1 (*)    MCV 72.2 (*)    MCH 22.4 (*)    RDW 17.5 (*)    All other components within normal limits  URINE  MICROSCOPIC-ADD ON - Abnormal; Notable for the following:    Squamous Epithelial / LPF FEW (*)    Bacteria, UA MANY (*)    All other components within normal limits  URINE CULTURE    Imaging Review Dg Chest 2 View  11/28/2014  CLINICAL DATA:  Constant mid chest pain for 2 days. EXAM: CHEST  2 VIEW COMPARISON:  None. FINDINGS: The cardiomediastinal contours are normal. The lungs are clear. Pulmonary vasculature is normal. No consolidation, pleural effusion, or pneumothorax. No acute osseous abnormalities are seen. IMPRESSION: No acute pulmonary process. Electronically Signed   By: Rubye OaksMelanie  Ehinger M.D.   On: 11/28/2014 19:42   I have personally reviewed and evaluated these images and lab results as part of my medical decision-making.   EKG Interpretation   Date/Time:  Wednesday November 28 2014 18:56:02 EDT Ventricular Rate:  100 PR Interval:  143 QRS Duration: 76 QT Interval:  295 QTC Calculation: 380 R Axis:   43  Text Interpretation:  Sinus tachycardia Borderline T abnormalities,  diffuse leads No old tracing to compare Confirmed by [facility]  MD-J, JON  ([id number]) on 11/28/2014 7:05:03 PM      MDM   Final diagnoses:  Pyelonephritis  Headache, unspecified headache type  Chest pain, unspecified chest pain type   Pt presenting with fevers, chills, left flank pain, headache and chest pain. Pt initial complaint of lumbar back pain is more consistent with flank pain on exam with positive CVA tenderness. Chest pain is sharp, shooting and intermittent. ECG non-ischemic. CXR negative. Chest pain is atypical and unlikely to be cardiac or pulmonary in origin. Pt with history of headaches and states her current one is similar to those experienced in the past. Denies urinary symptoms. Pt febrile to 102.9 in triage and given tylenol. Tylenol did not bring fever down, gave motrin which brought fever down to 99.9. Pt is tearful. Non-focal neuro exam. Left CVA tenderness. Abdomen benign. UA positive  for nitrites and bacteria consistent with pyelonephritis. Will give 1 g rocephin in ED. Pt noted to have Hgb of 8.7 with no recent comparisons. Pt endorses heavy menstrual cycle which she just ended 3 days ago. Not currently experiencing symptomatic anemia, this is appropriate for outpatient follow up.  At this time there does not appear to be any evidence of an acute emergency medical condition and the patient appears stable for discharge with appropriate outpatient follow up. Diagnosis was discussed with patient who verbalizes understanding and is agreeable to discharge. Will discharge with keflex for 10 days. Pt case discussed with Dr. Lynelle Doctor who agrees with my plan. Return precautions given in discharge paperwork and discussed with pt at bedside. Pt stable for discharge      Alveta Heimlich, PA-C 11/29/14 0103  Linwood Dibbles, MD 11/30/14 [id number]

## 2014-11-28 NOTE — ED Notes (Signed)
Pt assisted to BR.

## 2014-11-28 NOTE — ED Notes (Signed)
Pt placed on monitor.  

## 2014-11-28 NOTE — Discharge Instructions (Signed)
Pyelonephritis, Adult Pyelonephritis is a kidney infection. The kidneys are the organs that filter a person's blood and move waste out of the bloodstream and into the urine. Urine passes from the kidneys, through the ureters, and into the bladder. There are two main types of pyelonephritis:  Infections that come on quickly without any warning (acute pyelonephritis).  Infections that last for a long period of time (chronic pyelonephritis). In most cases, the infection clears up with treatment and does not cause further problems. More severe infections or chronic infections can sometimes spread to the bloodstream or lead to other problems with the kidneys. CAUSES This condition is usually caused by:  Bacteria traveling from the bladder to the kidney through infected urine. The urine in the bladder can become infected with bacteria from:  Bladder infection (cystitis).  Inflammation of the prostate gland (prostatitis).  Sexual intercourse, in females.  Bacteria traveling from the bloodstream to the kidney. RISK FACTORS This condition is more likely to develop in:  Pregnant women.  Older people.  People who have diabetes.  People who have kidney stones or bladder stones.  People who have other abnormalities of the kidney or ureter.  People who have a catheter placed in the bladder.  People who have cancer.  People who are sexually active.  Women who use spermicides.  People who have had a prior urinary tract infection. SYMPTOMS Symptoms of this condition include:  Frequent urination.  Strong or persistent urge to urinate.  Burning or stinging when urinating.  Abdominal pain.  Back pain.  Pain in the side or flank area.  Fever.  Chills.  Blood in the urine, or dark urine.  Nausea.  Vomiting. DIAGNOSIS This condition may be diagnosed based on:  Medical history and physical exam.  Urine tests.  Blood tests. You may also have imaging tests of the  kidneys, such as an ultrasound or CT scan. TREATMENT Treatment for this condition may depend on the severity of the infection.  If the infection is mild and is found early, you may be treated with antibiotic medicines taken by mouth. You will need to drink fluids to remain hydrated.  If the infection is more severe, you may need to stay in the hospital and receive antibiotics given directly into a vein through an IV tube. You may also need to receive fluids through an IV tube if you are not able to remain hydrated. After your hospital stay, you may need to take oral antibiotics for a period of time. Other treatments may be required, depending on the cause of the infection. HOME CARE INSTRUCTIONS Medicines  Take over-the-counter and prescription medicines only as told by your health care provider.  If you were prescribed an antibiotic medicine, take it as told by your health care provider. Do not stop taking the antibiotic even if you start to feel better. General Instructions  Drink enough fluid to keep your urine clear or pale yellow.  Avoid caffeine, tea, and carbonated beverages. They tend to irritate the bladder.  Urinate often. Avoid holding in urine for long periods of time.  Urinate before and after sex.  After a bowel movement, women should cleanse from front to back. Use each tissue only once.  Keep all follow-up visits as told by your health care provider. This is important. SEEK MEDICAL CARE IF:  Your symptoms do not get better after 2 days of treatment.  Your symptoms get worse.  You have a fever. SEEK IMMEDIATE MEDICAL CARE IF:  You  are unable to take your antibiotics or fluids.  You have shaking chills.  You vomit.  You have severe flank or back pain.  You have extreme weakness or fainting.   This information is not intended to replace advice given to you by your health care provider. Make sure you discuss any questions you have with your health care  provider.   Document Released: 01/26/2005 Document Revised: 10/17/2014 Document Reviewed: 05/21/2014 Elsevier Interactive Patient Education 2016 Else[employer] Inc.  Nonspecific Chest Pain  Chest pain can be caused by many different conditions. There is always a chance that your pain could be related to something serious, such as a heart attack or a blood clot in your lungs. Chest pain can also be caused by conditions that are not life-threatening. If you have chest pain, it is very important to follow up with your health care provider. CAUSES  Chest pain can be caused by:  Heartburn.  Pneumonia or bronchitis.  Anxiety or stress.  Inflammation around your heart (pericarditis) or lung (pleuritis or pleurisy).  A blood clot in your lung.  A collapsed lung (pneumothorax). It can develop suddenly on its own (spontaneous pneumothorax) or from trauma to the chest.  Shingles infection (varicella-zoster virus).  Heart attack.  Damage to the bones, muscles, and cartilage that make up your chest wall. This can include:  Bruised bones due to injury.  Strained muscles or cartilage due to frequent or repeated coughing or overwork.  Fracture to one or more ribs.  Sore cartilage due to inflammation (costochondritis). RISK FACTORS  Risk factors for chest pain may include:  Activities that increase your risk for trauma or injury to your chest.  Respiratory infections or conditions that cause frequent coughing.  Medical conditions or overeating that can cause heartburn.  Heart disease or family history of heart disease.  Conditions or health behaviors that increase your risk of developing a blood clot.  Having had chicken pox (varicella zoster). SIGNS AND SYMPTOMS Chest pain can feel like:  Burning or tingling on the surface of your chest or deep in your chest.  Crushing, pressure, aching, or squeezing pain.  Dull or sharp pain that is worse when you move, cough, or take a deep  breath.  Pain that is also felt in your back, neck, shoulder, or arm, or pain that spreads to any of these areas. Your chest pain may come and go, or it may stay constant. DIAGNOSIS Lab tests or other studies may be needed to find the cause of your pain. Your health care provider may have you take a test called an ambulatory ECG (electrocardiogram). An ECG records your heartbeat patterns at the time the test is performed. You may also have other tests, such as:  Transthoracic echocardiogram (TTE). During echocardiography, sound waves are used to create a picture of all of the heart structures and to look at how blood flows through your heart.  Transesophageal echocardiogram (TEE).This is a more advanced imaging test that obtains images from inside your body. It allows your health care provider to see your heart in finer detail.  Cardiac monitoring. This allows your health care provider to monitor your heart rate and rhythm in real time.  Holter monitor. This is a portable device that records your heartbeat and can help to diagnose abnormal heartbeats. It allows your health care provider to track your heart activity for several days, if needed.  Stress tests. These can be done through exercise or by taking medicine that makes your heart  beat more quickly.  Blood tests.  Imaging tests. TREATMENT  Your treatment depends on what is causing your chest pain. Treatment may include:  Medicines. These may include:  Acid blockers for heartburn.  Anti-inflammatory medicine.  Pain medicine for inflammatory conditions.  Antibiotic medicine, if an infection is present.  Medicines to dissolve blood clots.  Medicines to treat coronary artery disease.  Supportive care for conditions that do not require medicines. This may include:  Resting.  Applying heat or cold packs to injured areas.  Limiting activities until pain decreases. HOME CARE INSTRUCTIONS  If you were prescribed an  antibiotic medicine, finish it all even if you start to feel better.  Avoid any activities that bring on chest pain.  Do not use any tobacco products, including cigarettes, chewing tobacco, or electronic cigarettes. If you need help quitting, ask your health care provider.  Do not drink alcohol.  Take medicines only as directed by your health care provider.  Keep all follow-up visits as directed by your health care provider. This is important. This includes any further testing if your chest pain does not go away.  If heartburn is the cause for your chest pain, you may be told to keep your head raised (elevated) while sleeping. This reduces the chance that acid will go from your stomach into your esophagus.  Make lifestyle changes as directed by your health care provider. These may include:  Getting regular exercise. Ask your health care provider to suggest some activities that are safe for you.  Eating a heart-healthy diet. A registered dietitian can help you to learn healthy eating options.  Maintaining a healthy weight.  Managing diabetes, if necessary.  Reducing stress. SEEK MEDICAL CARE IF:  Your chest pain does not go away after treatment.  You have a rash with blisters on your chest.  You have a fever. SEEK IMMEDIATE MEDICAL CARE IF:   Your chest pain is worse.  You have an increasing cough, or you cough up blood.  You have severe abdominal pain.  You have severe weakness.  You faint.  You have chills.  You have sudden, unexplained chest discomfort.  You have sudden, unexplained discomfort in your arms, back, neck, or jaw.  You have shortness of breath at any time.  You suddenly start to sweat, or your skin gets clammy.  You feel nauseous or you vomit.  You suddenly feel light-headed or dizzy.  Your heart begins to beat quickly, or it feels like it is skipping beats. These symptoms may represent a serious problem that is an emergency. Do not wait to  see if the symptoms will go away. Get medical help right away. Call your local emergency services (911 in the [place] Do not drive yourself to the hospital.   This information is not intended to replace advice given to you by your health care provider. Make sure you discuss any questions you have with your health care provider.   Document Released: 11/05/2004 Document Revised: 02/16/2014 Document Reviewed: 09/01/2013 Elsevier Interactive Patient Education [employer]2016 Elsevier Inc.

## 2014-12-01 LAB — URINE CULTURE

## 2014-12-02 ENCOUNTER — Telehealth (HOSPITAL_COMMUNITY): Payer: Self-pay

## 2014-12-02 NOTE — Telephone Encounter (Signed)
Post ED Visit - Positive Culture Follow-up  Culture report reviewed by antimicrobial stewardship pharmacist:  []  Celedonio MiyamotoJeremy Frens, Pharm.D., BCPS []  Georgina PillionElizabeth Martin, Pharm.D., BCPS []  [place]view[place]h Pham, [place]., BCPS, AAHIVP []  Estella HuskMichelle Turner, Pharm.D., BCPS, AAHIVP []  [place]y Reyes, [address] [x]  Cassie Roseanne RenoStewart, [place]arm[place]D.  Positive urine culture Treated with cephalexin, organism sensitive to the same and no further patient follow-up is required at this time.  Ashley JacobsFesterman, Jadaya Sommerfield C 12/02/2014, 11:53 AM

## 2015-09-03 ENCOUNTER — Encounter (HOSPITAL_COMMUNITY): Payer: Self-pay

## 2015-09-03 ENCOUNTER — Emergency Department (HOSPITAL_COMMUNITY)
Admission: EM | Admit: 2015-09-03 | Discharge: 2015-09-04 | Disposition: A | Discharge status: Home / Self Care | Payer: Medicaid Other | Attending: Emergency Medicine | Admitting: Emergency Medicine

## 2015-09-03 DIAGNOSIS — L539 Erythematous condition, unspecified: Secondary | ICD-10-CM | POA: Diagnosis present

## 2015-09-03 DIAGNOSIS — Z7982 Long term (current) use of aspirin: Secondary | ICD-10-CM | POA: Insufficient documentation

## 2015-09-03 DIAGNOSIS — Z79899 Other long term (current) drug therapy: Secondary | ICD-10-CM | POA: Insufficient documentation

## 2015-09-03 DIAGNOSIS — N611 Abscess of the breast and nipple: Secondary | ICD-10-CM | POA: Diagnosis not present

## 2015-09-03 DIAGNOSIS — F17210 Nicotine dependence, cigarettes, uncomplicated: Secondary | ICD-10-CM | POA: Insufficient documentation

## 2015-09-03 DIAGNOSIS — L0291 Cutaneous abscess, unspecified: Secondary | ICD-10-CM

## 2015-09-03 DIAGNOSIS — Z792 Long term (current) use of antibiotics: Secondary | ICD-10-CM | POA: Diagnosis not present

## 2015-09-03 MED ORDER — DIPHENHYDRAMINE HCL 25 MG PO CAPS
25.0000 mg | ORAL_CAPSULE | ORAL | Status: AC
  Administered 2015-09-03: 25 mg via ORAL
  Filled 2015-09-03: qty 1

## 2015-09-03 MED ORDER — LIDOCAINE HCL (PF) 1 % IJ SOLN
2.0000 mL | Freq: Once | INTRAMUSCULAR | Status: AC
  Administered 2015-09-03: 2 mL
  Filled 2015-09-03: qty 30

## 2015-09-03 MED ORDER — HYDROCODONE-ACETAMINOPHEN 5-325 MG PO TABS
1.0000 | ORAL_TABLET | Freq: Once | ORAL | Status: AC
  Administered 2015-09-03: 1 via ORAL
  Filled 2015-09-03: qty 1

## 2015-09-03 NOTE — ED Provider Notes (Signed)
[facility]-EMERGENCY DEPT Provider Note   CSN: [id number] Arrival date & time: 09/03/15  2232  First Provider Contact:  First MD Initiated Contact with Patient 09/03/15 2326    By signing my name below, I, [name], attest that this documentation has been prepared under the direction and in the presence of Earley Favor, NP. Electronically Signed: [name], ED Scribe. 09/03/15. 2:22 AM.    History   Chief Complaint Chief Complaint  Patient presents with  . Insect Bite    HPI Virginia Ferguson is a 35 y.o. female.  HPI HPI Comments: Virginia Ferguson is a 35 y.o. female who presents to the Emergency Department complaining of possible insect bite to her left breast 1 day prior. Pt states she was under a tree and afterwards she noticed that her left breast was red swollen. Pt states she has associated pain. Pt also reports redness and swelling to her right upper eye lid. Pt denies any medications PTA. Pt doesn't report fever, nausea or vomiting.     1cm round firm area in the areolar approximately at 6 o clock position with surrounding erythema and tenderness, no drainage noted   Past Medical History:  Diagnosis Date  . Medical history non-contributory     There are no active problems to display for this patient.   Past Surgical History:  Procedure Laterality Date  . NO PAST SURGERIES      OB History    Gravida Para Term Preterm AB Living   SAB TAB Ectopic Multiple Live Births                   Home Medications    Prior to Admission medications   Medication Sig Start Date End Date Taking? Authorizing Provider  aspirin-acetaminophen-caffeine (EXCEDRIN MIGRAINE) [phone] MG per tablet Take 2 tablets by mouth every 6 (six) hours as needed for headache or migraine.    Historical Provider, MD  cephALEXin (KEFLEX) 500 MG capsule Take 1 capsule (500 mg total) by mouth 3 (three) times daily. 11/28/14   Stevi Barrett, PA-C  cyclobenzaprine (FLEXERIL) 10 MG  tablet Take 1 tablet (10 mg total) by mouth 2 (two) times daily as needed for muscle spasms. 05/15/14   Oswaldo Conroy, PA-C  HYDROcodone-acetaminophen (NORCO/VICODIN) 5-325 MG tablet Take 2 tablets by mouth every 4 (four) hours as needed. 09/04/15   Earley Favor, NP  methocarbamol (ROBAXIN) 750 MG tablet Take 750 mg by mouth every 8 (eight) hours as needed for muscle spasms.    Historical Provider, MD  sulfamethoxazole-trimethoprim (BACTRIM DS,SEPTRA DS) 800-160 MG tablet Take 1 tablet by mouth 2 (two) times daily. 09/04/15 09/11/15  Earley Favor, NP    Family History Family History  Problem Relation Age of Onset  . Hypertension Mother   . Diabetes Mother     Social History Social History  Substance Use Topics  . Smoking status: Current Some Day Smoker    Packs/day: 0.50    Types: Cigarettes  . Smokeless tobacco: Never Used  . Alcohol use No     Allergies   Review of patient's allergies indicates no known allergies.   Review of Systems Review of Systems   Physical Exam Updated Vital Signs BP 114/71 (BP Location: Right Arm)   Pulse 86   Temp 99.3 F (37.4 C) (Oral)   Ht  (1.626 m)   Wt 124.7 kg   LMP 08/23/2015   SpO2 100%  BMI 47.20 kg/m   Physical Exam  Constitutional: She is oriented to person, place, and time. She appears well-developed and well-nourished. No distress.  HENT:  Head: Normocephalic and atraumatic.  Eyes: Conjunctivae and EOM are normal.  right upper lid redness and swelling small whitish area consistent with insect bite.   Neck: Neck supple. No tracheal deviation present.  Cardiovascular: Normal rate.   Pulmonary/Chest: Effort normal. No respiratory distress.  Musculoskeletal: Normal range of motion.  Neurological: She is alert and oriented to person, place, and time.  Skin: Skin is warm and dry.  1cm round firm area in the areolar approximately at 6 o clock position with surrounding erythema and tenderness, no drainage noted.  Psychiatric:  She has a normal mood and affect. Her behavior is normal.  Nursing note and vitals reviewed.    ED Treatments / Results  DIAGNOSTIC STUDIES: Oxygen Saturation is 100% on RA, normal by my interpretation.    COORDINATION OF CARE: 11:42 PM-Discussed treatment plan with pt at bedside and pt agreed to plan.     Labs (all labs ordered are listed, but only abnormal results are displayed) Labs Reviewed - No data to display  EKG  EKG Interpretation None       Radiology No results found.  Procedures .Marland KitchenIncision and Drainage Date/Time: 09/04/2015 2:16 AM Performed by: Earley Favor Authorized by: Earley Favor   Consent:    Consent obtained:  Verbal   Consent given by:  Patient   Risks discussed:  Pain and infection   Alternatives discussed:  No treatment Location:    Type:  Abscess   Size:  1   Location: Left Breast. Pre-procedure details:    Skin preparation:  Betadine Procedure type:    Complexity:  Simple Procedure details:    Needle aspiration: yes     Needle size:  18 G   Drainage:  Purulent   Drainage amount:  Scant   Wound treatment:  Wound left open Post-procedure details:    Patient tolerance of procedure:  Tolerated well, no immediate complications   (including critical care time)  Medications Ordered in ED Medications  fluconazole (DIFLUCAN) tablet 150 mg (not administered)  diphenhydrAMINE (BENADRYL) capsule 25 mg (25 mg Oral Given 09/03/15 2359)  HYDROcodone-acetaminophen (NORCO/VICODIN) 5-325 MG per tablet 1 tablet (1 tablet Oral Given 09/03/15 2359)  lidocaine (PF) (XYLOCAINE) 1 % injection 2 mL (2 mLs Infiltration Given 09/03/15 2359)  sulfamethoxazole-trimethoprim (BACTRIM DS,SEPTRA DS) 800-160 MG per tablet 1 tablet (1 tablet Oral Given 09/04/15 0139)     Initial Impression / Assessment and Plan / ED Course  I have reviewed the triage vital signs and the nursing notes.  Pertinent labs & imaging results that were available during my care of the  patient were reviewed by me and considered in my medical decision making (see chart for details).  Clinical Course    Needle aspiration with scant return    Final Clinical Impressions(s) / ED Diagnoses   Final diagnoses:  Abscess    New Prescriptions New Prescriptions   HYDROCODONE-ACETAMINOPHEN (NORCO/VICODIN) 5-325 MG TABLET    Take 2 tablets by mouth every 4 (four) hours as needed.   SULFAMETHOXAZOLE-TRIMETHOPRIM (BACTRIM DS,SEPTRA DS) 800-160 MG TABLET    Take 1 tablet by mouth 2 (two) times daily.      I personally performed the services described in this documentation, which was scribed in my presence. The recorded information has been reviewed and is accurate.    Earley Favor, NP 09/04/15 [phone]  Earley Favor, NP 09/04/15 0222    Dione Booze, MD 09/04/15 [phone]

## 2015-09-03 NOTE — ED Triage Notes (Signed)
Pt complains of a possible insect bite on her left breast yesterday, she was under a tree and then after her shower noticed her breast was red and swollen

## 2015-09-04 MED ORDER — HYDROCODONE-ACETAMINOPHEN 5-325 MG PO TABS: 2.0000 | ORAL_TABLET | ORAL | 0 refills | Status: DC | PRN

## 2015-09-04 MED ORDER — SULFAMETHOXAZOLE-TRIMETHOPRIM 800-160 MG PO TABS
1.0000 | ORAL_TABLET | Freq: Once | ORAL | Status: AC
  Administered 2015-09-04: 1 via ORAL
  Filled 2015-09-04: qty 1

## 2015-09-04 MED ORDER — SULFAMETHOXAZOLE-TRIMETHOPRIM 800-160 MG PO TABS: 1.0000 | ORAL_TABLET | Freq: Two times a day (BID) | ORAL | 0 refills | Status: AC

## 2015-09-04 MED ORDER — FLUCONAZOLE 150 MG PO TABS
150.0000 mg | ORAL_TABLET | Freq: Once | ORAL | Status: AC
  Administered 2015-09-04: 150 mg via ORAL
  Filled 2015-09-04: qty 1

## 2015-09-04 NOTE — Discharge Instructions (Signed)
You had a needle aspiration of a small amount of purulent material I would recommend warm compresses several times daily the antibiotic as directed and follow up with your PCP in 2 days

## 2015-09-10 ENCOUNTER — Emergency Department (HOSPITAL_COMMUNITY): Payer: Medicaid Other

## 2015-09-10 ENCOUNTER — Encounter (HOSPITAL_COMMUNITY): Payer: Self-pay | Admitting: *Deleted

## 2015-09-10 ENCOUNTER — Emergency Department (HOSPITAL_COMMUNITY)
Admission: EM | Admit: 2015-09-10 | Discharge: 2015-09-10 | Disposition: A | Discharge status: Home / Self Care | Payer: Medicaid Other | Attending: Emergency Medicine | Admitting: Emergency Medicine

## 2015-09-10 DIAGNOSIS — Z79899 Other long term (current) drug therapy: Secondary | ICD-10-CM | POA: Insufficient documentation

## 2015-09-10 DIAGNOSIS — Z7982 Long term (current) use of aspirin: Secondary | ICD-10-CM | POA: Insufficient documentation

## 2015-09-10 DIAGNOSIS — F17210 Nicotine dependence, cigarettes, uncomplicated: Secondary | ICD-10-CM | POA: Diagnosis not present

## 2015-09-10 DIAGNOSIS — N611 Abscess of the breast and nipple: Secondary | ICD-10-CM | POA: Insufficient documentation

## 2015-09-10 DIAGNOSIS — N61 Mastitis without abscess: Secondary | ICD-10-CM | POA: Diagnosis present

## 2015-09-10 DIAGNOSIS — L03818 Cellulitis of other sites: Secondary | ICD-10-CM

## 2015-09-10 DIAGNOSIS — N644 Mastodynia: Secondary | ICD-10-CM

## 2015-09-10 MED ORDER — IBUPROFEN 800 MG PO TABS
800.0000 mg | ORAL_TABLET | Freq: Once | ORAL | Status: AC
  Administered 2015-09-10: 800 mg via ORAL
  Filled 2015-09-10: qty 1

## 2015-09-10 MED ORDER — OXYCODONE-ACETAMINOPHEN 5-325 MG PO TABS: 1.0000 | ORAL_TABLET | Freq: Once | ORAL | Status: DC

## 2015-09-10 MED ORDER — NAPROXEN 500 MG PO TABS: 500.0000 mg | ORAL_TABLET | Freq: Two times a day (BID) | ORAL | 0 refills | Status: DC

## 2015-09-10 MED ORDER — CEPHALEXIN 500 MG PO CAPS: 500.0000 mg | ORAL_CAPSULE | Freq: Four times a day (QID) | ORAL | 0 refills | Status: DC

## 2015-09-10 NOTE — Discharge Instructions (Addendum)
Please follow up with the breast center for further evaluation of the likely 2.5 cm breast abscess that is causing your pain.  Please arrive there tomorrow morning.  Also start taking Keflex in addition to the Bactrim.  Add Naproxen to your Norco for pain control.

## 2015-09-10 NOTE — ED Triage Notes (Signed)
Pt complains of painful swollen area on left breast. Pt was seen at [facility] on 7/25 for possible insect bite but states pain is worse. Pt denies drainage from site.

## 2015-09-10 NOTE — ED Provider Notes (Signed)
[facility]-EMERG[facility]Y DEPT Provider Note   CSN: [id number] Arrival date & time: 09/10/15  1447  First Provider Contact:  None    By signing my name below, I, [name], attest that this documentation has been prepared under the direction and in the presence of non-physician practitioner, Cheri Fowler, PA-C. Electronically Signed: [name], Scribe. 09/10/2015. 3:36 PM.  History   Chief Complaint Chief Complaint  Patient presents with  . Insect Bite   The history is provided by the patient. No language interpreter was used.   HPI Comments: Virginia Ferguson is a 35 y.o. female who presents to the Emergency Department complaining of gradually worsening, pain surrounding a swollen area on her left breast that appeared ~1 week ago and worsened today. Pt reports she visited [facility] ED on 7/25 stating she had an insect bite on her left breast; she notes the area was drained and she was prescribed antibiotics. She notes she has been taking her antibiotics as prescribed but the site has become larger and increasingly more painful. She reports associated chills but denies fever, nausea and vomiting.   Past Medical History:  Diagnosis Date  . Medical history non-contributory    There are no active problems to display for this patient.  Past Surgical History:  Procedure Laterality Date  . NO PAST SURGERIES     OB History    Gravida Para Term Preterm AB Living   1 1 1     1    SAB TAB Ectopic Multiple Live Births                  Home Medications    Prior to Admission medications   Medication Sig Start Date End Date Taking? Authorizing Provider  aspirin-acetaminophen-caffeine (EXCEDRIN MIGRAINE) [phone] MG per tablet Take 2 tablets by mouth every 6 (six) hours as needed for headache or migraine.    Historical Provider, MD  cephALEXin (KEFLEX) 500 MG capsule Take 1 capsule (500 mg total) by mouth 4 (four) times daily. 09/10/15   Cheri Fowler, PA-C  cyclobenzaprine (FLEXERIL) 10 MG tablet Take 1 tablet  (10 mg total) by mouth 2 (two) times daily as needed for muscle spasms. 05/15/14   Oswaldo Conroy, PA-C  HYDROcodone-acetaminophen (NORCO/VICODIN) 5-325 MG tablet Take 2 tablets by mouth every 4 (four) hours as needed. 09/04/15   Earley Favor, NP  methocarbamol (ROBAXIN) 750 MG tablet Take 750 mg by mouth every 8 (eight) hours as needed for muscle spasms.    Historical Provider, MD  naproxen (NAPROSYN) 500 MG tablet Take 1 tablet (500 mg total) by mouth 2 (two) times daily. 09/10/15   Cheri Fowler, PA-C  sulfamethoxazole-trimethoprim (BACTRIM DS,SEPTRA DS) 800-160 MG tablet Take 1 tablet by mouth 2 (two) times daily. 09/04/15 09/11/15  Earley Favor, NP    Family History Family History  Problem Relation Age of Onset  . Hypertension Mother   . Diabetes Mother     Social History Social History  Substance Use Topics  . Smoking status: Current Some Day Smoker    Packs/day: 0.50    Types: Cigarettes  . Smokeless tobacco: Never Used  . Alcohol use No     Allergies   Review of patient's allergies indicates no known allergies.   Review of Systems Review of Systems  Constitutional: Positive for chills. Negative for fever.  Gastrointestinal: Negative for nausea and vomiting.  Skin: Positive for wound.  All other systems reviewed and are negative.  Physical Exam Updated Vital Signs BP 114/78 (BP Location:  Left Arm)   Pulse 95   Temp 99.5 F (37.5 C) (Oral)   Resp 18   LMP 08/23/2015   SpO2 96%   Physical Exam  Constitutional: She is oriented to person, place, and time. She appears well-developed and well-nourished.  HENT:  Head: Normocephalic and atraumatic.  Right Ear: External ear normal.  Left Ear: External ear normal.  Eyes: Conjunctivae are normal. No scleral icterus.  Neck: No tracheal deviation present.  Cardiovascular: Normal rate and regular rhythm.   Pulmonary/Chest: Effort normal and breath sounds normal. No respiratory distress.  Abdominal: Soft. Bowel sounds are normal.  She exhibits no distension.  Genitourinary:  Genitourinary Comments: Chaperone present. Area of warmth, erythema, induration, and deep fluctuance beneath left areola.  No drainage.  Musculoskeletal: Normal range of motion.  Neurological: She is alert and oriented to person, place, and time.  Skin: Skin is warm and dry.  Psychiatric: She has a normal mood and affect. Her behavior is normal.   ED Treatments / Results  Labs (all labs ordered are listed, but only abnormal results are displayed) Labs Reviewed - No data to display  EKG  EKG Interpretation None      Radiology US Breast Ltd Uni Left Inc Axilla  Result Date: 09/10/2015 CLINICAL DATA:  Breast pain for 1 week.  Swelling and skin redness. EXAM: ULTRASOUND OF THE LEFT BREAST COMPARISON:  None FINDINGS: Targeted ultrasound is performed, showing a hypoechoic collection versus mass in the left breast measuring 2.7 x 1.1 x 2.5 cm, labeled as 2 cm inferior to the nipple. IMPRESSION: Probable abscess within the inferior left breast measuring 2.7 x 1.1 x 2.5 cm. RECOMMENDATION: Per discussion with the emergency room physician, patient will be started on antibiotics and will be sent to the [facility] in the morning for complete characterization and possible drainage. I have discussed the findings and recommendations with the patient. Results were also provided in writing at the conclusion of the visit. If applicable, a reminder letter will be sent to the patient regarding the next appointment. BI-RADS CATEGORY  3: Probably benign finding(s) - short interval follow-up suggested. Electronically Signed   By: Bary Richard M.D.   On: 09/10/2015 18:18    Procedures Procedures  DIAGNOSTIC STUDIES:  Oxygen Saturation is 96% on RA, normal by my interpretation.    COORDINATION OF CARE:  3:29 PM Discussed treatment plan, which includes [place] left breast with pt at bedside and pt agreed to plan.   Medications Ordered in  ED Medications  ibuprofen (ADVIL,MOTRIN) tablet 800 mg (800 mg Oral Given 09/10/15 1813)     Initial Impression / Assessment and Plan / ED Course  I have reviewed the triage vital signs and the nursing notes.  Pertinent labs & imaging results that were available during my care of the patient were reviewed by me and considered in my medical decision making (see chart for details).  Clinical Course   Patient presents with continued left breast pain after I&D on 09/03/15 been taking Bactrim since.  No improvement.  States worse.  VSS, NAD.  Patient non-toxic or ill.  No systemic symptoms.  No indication for lab work.  Area of erythema, warmth, induration, and some deep fluctuance beneath areola.  Concern for possible deep tissue abscess.  Ultrasound shows likely 2.7x1.1x2.5 cm abscess in left breast.  Patient on Bactrim, will add Keflex.  Has norco, will add Naproxen.  Follow up [facility] in the AM.  Discussed  these findings and the plan with the patient.  Return precautions discussed.  She agrees and acknowledges the above plan for discharge.    Final Clinical Impressions(s) / ED Diagnoses   Final diagnoses:  Breast pain, left  Cellulitis of other specified site  Breast abscess    New Prescriptions New Prescriptions   CEPHALEXIN (KEFLEX) 500 MG CAPSULE    Take 1 capsule (500 mg total) by mouth 4 (four) times daily.   NAPROXEN (NAPROSYN) 500 MG TABLET    Take 1 tablet (500 mg total) by mouth 2 (two) times daily.   I personally performed the services described in this documentation, which was scribed in my presence. The recorded information has been reviewed and is accurate.     Cheri Fowler, PA-C 09/10/15 1610    Rolan Bucco, MD 09/10/15 [phone]

## 2015-09-10 NOTE — Progress Notes (Signed)
pcp is [facility] [address] [facility] [place]

## 2015-09-11 ENCOUNTER — Ambulatory Visit: Payer: Medicaid Other

## 2015-09-11 ENCOUNTER — Other Ambulatory Visit (HOSPITAL_COMMUNITY): Payer: Self-pay | Admitting: Emergency Medicine

## 2015-09-11 ENCOUNTER — Encounter (HOSPITAL_COMMUNITY): Payer: Self-pay

## 2015-09-11 ENCOUNTER — Ambulatory Visit: Admit: 2015-09-11 | Payer: Medicaid Other

## 2015-09-11 ENCOUNTER — Ambulatory Visit
Admission: RE | Admit: 2015-09-11 | Discharge: 2015-09-11 | Disposition: A | Discharge status: Home / Self Care | Payer: Medicaid Other | Source: Ambulatory Visit | Attending: Emergency Medicine | Admitting: Emergency Medicine

## 2015-09-11 ENCOUNTER — Emergency Department (HOSPITAL_COMMUNITY)
Admission: EM | Admit: 2015-09-11 | Discharge: 2015-09-11 | Disposition: A | Discharge status: Home / Self Care | Payer: Medicaid Other | Attending: Emergency Medicine | Admitting: Emergency Medicine

## 2015-09-11 ENCOUNTER — Ambulatory Visit
Admit: 2015-09-11 | Discharge: 2015-09-11 | Disposition: A | Discharge status: Home / Self Care | Payer: Medicaid Other | Attending: Emergency Medicine | Admitting: Emergency Medicine

## 2015-09-11 DIAGNOSIS — L0291 Cutaneous abscess, unspecified: Secondary | ICD-10-CM

## 2015-09-11 DIAGNOSIS — N611 Abscess of the breast and nipple: Secondary | ICD-10-CM | POA: Diagnosis not present

## 2015-09-11 DIAGNOSIS — F17210 Nicotine dependence, cigarettes, uncomplicated: Secondary | ICD-10-CM | POA: Diagnosis not present

## 2015-09-11 DIAGNOSIS — Z79899 Other long term (current) drug therapy: Secondary | ICD-10-CM | POA: Insufficient documentation

## 2015-09-11 DIAGNOSIS — Z7982 Long term (current) use of aspirin: Secondary | ICD-10-CM | POA: Diagnosis not present

## 2015-09-11 NOTE — Discharge Instructions (Signed)
Go to the breast center this afternoon as we discussed. They should be expecting you

## 2015-09-11 NOTE — ED Notes (Signed)
Assessment: patient's left breast noted to be mildly swollen around the areola. Warm to touch. Tender upon palpation. Redness noted. Patient denies any sensation changes. Patient rates pain 7/10

## 2015-09-11 NOTE — ED Notes (Signed)
Patient given warm blanket. Patient given update on plan of care. Patient agreeable.

## 2015-09-11 NOTE — ED Triage Notes (Signed)
Pt had insect bite on left breast and it has developed an abscess. Pt was seen at [facility] for same and was told they were unable to drain it. Breast noted to be swollen and warm to touch.

## 2015-09-11 NOTE — ED Provider Notes (Signed)
[facility]-EMERGENCY DEPT Provider Note   CSN: [id number] Arrival date & time: 09/11/15  7829  First Provider Contact:  First MD Initiated Contact with Patient 09/11/15 1001        History   Chief Complaint Chief Complaint  Patient presents with  . Abscess    HPI Virginia Ferguson is a 35 y.o. female.  HPI Patient started developing breast redness and swelling approximately 1 week ago. In the emergency room and started on antibiotics. Patient returned to [facility] emergency room yesterday because her symptoms were not improving and she was having increasing pain in her left breast at the inferior aspect of her nipple/areola.  Patient had an ultrasound of her breast yesterday that showed an abscess. The patient was given an additional course of antibiotics and the plan was for her to follow-up at the breast center this morning to have probable needle aspiration. Patient states she went to the breast Center this morning and was told that she was going to need to have a referral from her primary care doctor. Patient does not currently have a primary care doctor. They told her to come back to the emergency room.  She denies any fevers but she has felt chilled. Past Medical History:  Diagnosis Date  . Medical history non-contributory     There are no active problems to display for this patient.   Past Surgical History:  Procedure Laterality Date  . NO PAST SURGERIES      OB History    Gravida Para Term Preterm AB Living   SAB TAB Ectopic Multiple Live Births                   Home Medications    Prior to Admission medications   Medication Sig Start Date End Date Taking? Authorizing Provider  aspirin-acetaminophen-caffeine (EXCEDRIN MIGRAINE) [phone] MG per tablet Take 2 tablets by mouth every 6 (six) hours as needed for headache or migraine.    Historical Provider, MD  cephALEXin (KEFLEX) 500 MG capsule Take 1 capsule (500 mg total) by mouth 4 (four) times daily.  09/10/15   Cheri Fowler, PA-C  cyclobenzaprine (FLEXERIL) 10 MG tablet Take 1 tablet (10 mg total) by mouth 2 (two) times daily as needed for muscle spasms. 05/15/14   Oswaldo Conroy, PA-C  HYDROcodone-acetaminophen (NORCO/VICODIN) 5-325 MG tablet Take 2 tablets by mouth every 4 (four) hours as needed. 09/04/15   Earley Favor, NP  methocarbamol (ROBAXIN) 750 MG tablet Take 750 mg by mouth every 8 (eight) hours as needed for muscle spasms.    Historical Provider, MD  naproxen (NAPROSYN) 500 MG tablet Take 1 tablet (500 mg total) by mouth 2 (two) times daily. 09/10/15   Cheri Fowler, PA-C  sulfamethoxazole-trimethoprim (BACTRIM DS,SEPTRA DS) 800-160 MG tablet Take 1 tablet by mouth 2 (two) times daily. 09/04/15 09/11/15  Earley Favor, NP    Family History Family History  Problem Relation Age of Onset  . Hypertension Mother   . Diabetes Mother     Social History Social History  Substance Use Topics  . Smoking status: Current Some Day Smoker    Packs/day: 0.50    Types: Cigarettes  . Smokeless tobacco: Never Used  . Alcohol use No     Allergies   Review of patient's allergies indicates no known allergies.   Review of Systems Review of Systems  All other systems reviewed and are negative.    Physical Exam  Updated Vital Signs BP 101/65 (BP Location: Right Arm)   Pulse 67   Temp 98.8 F (37.1 C) (Oral)   Resp 14   Ht 5\' 4"  (1.626 m)   Wt 124.7 kg   LMP 08/23/2015   SpO2 100%   BMI 47.20 kg/m   Physical Exam  Constitutional: She appears well-developed and well-nourished. No distress.  HENT:  Head: Normocephalic and atraumatic.  Right Ear: External ear normal.  Left Ear: External ear normal.  Eyes: Conjunctivae are normal. Right eye exhibits no discharge. Left eye exhibits no discharge. No scleral icterus.  Neck: Neck supple. No tracheal deviation present.  Cardiovascular: Normal rate, regular rhythm and intact distal pulses.   Pulmonary/Chest: Effort normal and breath sounds  normal. No stridor. No respiratory distress. She has no wheezes. She has no rales. Left breast exhibits tenderness. Left breast exhibits no inverted nipple and no nipple discharge. There is breast swelling.  Induration and ttp left breast approx 6 oclock position left areola  Abdominal: Soft. Bowel sounds are normal. She exhibits no distension. There is no tenderness. There is no rebound and no guarding.  Musculoskeletal: She exhibits no edema or tenderness.  Neurological: She is alert. She has normal strength. No cranial nerve deficit (no facial droop, extraocular movements intact, no slurred speech) or sensory deficit. She exhibits normal muscle tone. She displays no seizure activity. Coordination normal.  Skin: Skin is warm and dry. No rash noted.  Psychiatric: She has a normal mood and affect.  Nursing note and vitals reviewed.    ED Treatments / Results  Labs (all labs ordered are listed, but only abnormal results are displayed) Labs Reviewed - No data to display   Radiology US Breast Ltd Uni Left Inc Axilla  Result Date: 09/10/2015 CLINICAL DATA:  Breast pain for 1 week.  Swelling and skin redness. EXAM: ULTRASOUND OF THE LEFT BREAST COMPARISON:  None FINDINGS: Targeted ultrasound is performed, showing a hypoechoic collection versus mass in the left breast measuring 2.7 x 1.1 x 2.5 cm, labeled as 2 cm inferior to the nipple. IMPRESSION: Probable abscess within the inferior left breast measuring 2.7 x 1.1 x 2.5 cm. RECOMMENDATION: Per discussion with the emergency room physician, patient will be started on antibiotics and will be sent to the [facility] in the morning for complete characterization and possible drainage. I have discussed the findings and recommendations with the patient. Results were also provided in writing at the conclusion of the visit. If applicable, a reminder letter will be sent to the patient regarding the next appointment. BI-RADS CATEGORY  3:  Probably benign finding(s) - short interval follow-up suggested. Electronically Signed   By: Bary Richard M.D.   On: 09/10/2015 18:18    Procedures Procedures (including critical care time)  Medications Ordered in ED Medications - No data to display   Initial Impression / Assessment and Plan / ED Course  I have reviewed the triage vital signs and the nursing notes.  Pertinent labs & imaging results that were available during my care of the patient were reviewed by me and considered in my medical decision making (see chart for details).  Clinical Course  Comment By Time  Left message for Interventional radiologist who is currently scrubbed into a procedure.  Linwood Dibbles, MD 08/02 1021  Discussed with IR MD.  Would be preferable to have her seen in the breast center.  Will wait for call back from the breast center Linwood Dibbles, MD 08/02 1121  I discussed the case with Dr Earlene Plater and Cira Rue.  Pt will go back to the breast center this afternoon.  The appropriate test will be entered under my name by Ms. Fredric Mare.  Discussed this plan with the patient.  Final Clinical Impressions(s) / ED Diagnoses   Final diagnoses:  Abscess      Linwood Dibbles, MD 09/11/15 1156

## 2015-09-16 ENCOUNTER — Other Ambulatory Visit (HOSPITAL_COMMUNITY): Payer: Self-pay | Admitting: Emergency Medicine

## 2015-09-16 ENCOUNTER — Ambulatory Visit
Admission: RE | Admit: 2015-09-16 | Discharge: 2015-09-16 | Disposition: A | Discharge status: Home / Self Care | Payer: Medicaid Other | Source: Ambulatory Visit | Attending: Emergency Medicine | Admitting: Emergency Medicine

## 2015-09-16 DIAGNOSIS — L0291 Cutaneous abscess, unspecified: Secondary | ICD-10-CM

## 2015-09-20 ENCOUNTER — Other Ambulatory Visit: Payer: Medicaid Other

## 2015-09-23 ENCOUNTER — Ambulatory Visit: Payer: Medicaid Other | Admitting: Family Medicine

## 2015-09-23 NOTE — Progress Notes (Deleted)
Subjective:     Patient ID: Levonne Lappingevonya L Wilshire, female   DOB: 1980-12-03, 35 y.o.   MRN: [id number]  HPI Mrs. Cherre HugerMack is a 35yo female presenting today to establish care. - PMH: None - Family History:  Asthma in Sister/Brother, Diabetes in Mother/Brother/Grandparents/Aunts/Uncles, hypertension in Mother/Brother/Grandparents/Aunts/Uncles, Stroke in Brother - Social: Works at [employer]. Engineer, agriculturalHigh School Graduate. Smokes ***. Denies recreational drug use. Denies alcohol use. - Health Maintenance: Mammogram in 2017, Pap Smear in 2016, Tetanus in 2015  - Chart Review shows she was seen in the ED on 8/1 and 8/2 for cellulitis and abscess of the left breast. [facility], but was told she could not be seen until she had a referral from her PCP. Patient did not have a PCP at the time and has decided to [facility].   Review of Systems     Objective:   Physical Exam     Assessment:     ***    Plan:     ***

## 2015-11-01 ENCOUNTER — Inpatient Hospital Stay: Admission: RE | Admit: 2015-11-01 | Payer: Medicaid Other | Source: Ambulatory Visit

## 2017-02-12 ENCOUNTER — Ambulatory Visit (HOSPITAL_COMMUNITY)
Admission: EM | Admit: 2017-02-12 | Discharge: 2017-02-12 | Disposition: A | Discharge status: Home / Self Care | Payer: Self-pay | Attending: Emergency Medicine | Admitting: Emergency Medicine

## 2017-02-12 ENCOUNTER — Encounter (HOSPITAL_COMMUNITY): Payer: Self-pay | Admitting: Emergency Medicine

## 2017-02-12 DIAGNOSIS — R519 Headache, unspecified: Secondary | ICD-10-CM

## 2017-02-12 DIAGNOSIS — R51 Headache: Secondary | ICD-10-CM

## 2017-02-12 DIAGNOSIS — M542 Cervicalgia: Secondary | ICD-10-CM

## 2017-02-12 DIAGNOSIS — G501 Atypical facial pain: Secondary | ICD-10-CM

## 2017-02-12 MED ORDER — DEXAMETHASONE SODIUM PHOSPHATE 10 MG/ML IJ SOLN
INTRAMUSCULAR | Status: AC
  Filled 2017-02-12: qty 1

## 2017-02-12 MED ORDER — KETOROLAC TROMETHAMINE 60 MG/2ML IM SOLN
60.0000 mg | Freq: Once | INTRAMUSCULAR | Status: AC
  Administered 2017-02-12: 60 mg via INTRAMUSCULAR

## 2017-02-12 MED ORDER — DEXAMETHASONE SODIUM PHOSPHATE 10 MG/ML IJ SOLN
10.0000 mg | Freq: Once | INTRAMUSCULAR | Status: AC
  Administered 2017-02-12: 10 mg via INTRAMUSCULAR

## 2017-02-12 MED ORDER — METOCLOPRAMIDE HCL 5 MG/ML IJ SOLN
5.0000 mg | Freq: Once | INTRAMUSCULAR | Status: AC
  Administered 2017-02-12: 5 mg via INTRAMUSCULAR

## 2017-02-12 MED ORDER — KETOROLAC TROMETHAMINE 60 MG/2ML IM SOLN
INTRAMUSCULAR | Status: AC
  Filled 2017-02-12: qty 2

## 2017-02-12 MED ORDER — METOCLOPRAMIDE HCL 5 MG/ML IJ SOLN
INTRAMUSCULAR | Status: AC
  Filled 2017-02-12: qty 2

## 2017-02-12 MED ORDER — AMOXICILLIN-POT CLAVULANATE 875-125 MG PO TABS: 1.0000 | ORAL_TABLET | Freq: Two times a day (BID) | ORAL | 0 refills | Status: AC

## 2017-02-12 MED ORDER — FLUCONAZOLE 150 MG PO TABS: 150.0000 mg | ORAL_TABLET | Freq: Once | ORAL | 0 refills | Status: AC

## 2017-02-12 MED ORDER — HYDROCODONE-ACETAMINOPHEN 5-325 MG PO TABS: 1.0000 | ORAL_TABLET | ORAL | 0 refills | Status: AC | PRN

## 2017-02-12 NOTE — Discharge Instructions (Signed)
Please use dental resource to contact offices to seek permenant treatment/relief.   Today we have given you an antibiotic. This should help with pain as any infection is cleared.   For pain please take 600mg -800mg  of Ibuprofen every 8 hours, take with 1000 mg of Tylenol Extra strength every 8 hours. These are safe to take together. Please take with food.   I have also provided 3 days worth of stronger pain medication. This should only be used for severe pain. Do not drive or operate machinery while taking this medication.   Please return if you start to experience significant swelling of your face, experiencing fever. Lightheadedness weakness, speech difficulty, chest pain.   Follow up with dentist for removal of tooth.

## 2017-02-12 NOTE — ED Provider Notes (Signed)
[facility]    CSN: [id number] Arrival date & time: 02/12/17  1330     History   Chief Complaint Chief Complaint  Patient presents with  . Otalgia    HPI Virginia Ferguson is a 37 y.o. female presenting with left sided facial pain for 5 days. She states pain radiates up into her temples, into ear, neck. She states she does have a chipped tooth on the left side of her mouth, was supposed to get it pulled but didn't, has a Journalist, newspaperdentist and dental insurance. Worsens with eating. Has tried ibuprofen, oragel, garlic, saltwater. No associated URI symptoms of congestion/cough. No visual disturbance, no lightheadedness, dizziness, speech slurring. No weakness, No SOB, no chest pain. Patient is a smoker, no history of DM or HTN.   HPI  Past Medical History:  Diagnosis Date  . Medical history non-contributory     There are no active problems to display for this patient.   Past Surgical History:  Procedure Laterality Date  . NO PAST SURGERIES      OB History    Gravida Para Term Preterm AB Living   1 1 1     1    SAB TAB Ectopic Multiple Live Births                   Home Medications    Prior to Admission medications   Medication Sig Start Date End Date Taking? Authorizing Provider  aspirin-acetaminophen-caffeine (EXCEDRIN MIGRAINE) [phone] MG per tablet Take 2 tablets by mouth every 6 (six) hours as needed for headache or migraine.   Yes [provider]  amoxicillin-clavulanate (AUGMENTIN) 875-125 MG tablet Take 1 tablet by mouth every 12 (twelve) hours for 5 days. 02/12/17 02/17/17  Donnamaria Shands C, PA-C  cephALEXin (KEFLEX) 500 MG capsule Take 1 capsule (500 mg total) by mouth 4 (four) times daily. 09/10/15   Cheri Fowlerose, Kayla, PA-C  cyclobenzaprine (FLEXERIL) 10 MG tablet Take 1 tablet (10 mg total) by mouth 2 (two) times daily as needed for muscle spasms. 05/15/14   Oswaldo Conroyreech, Victoria, PA-C  fluconazole (DIFLUCAN) 150 MG tablet Take 1 tablet (150 mg total) by mouth once  for 1 dose. 02/12/17 02/12/17  Marlayna Bannister C, PA-C  HYDROcodone-acetaminophen (NORCO/VICODIN) 5-325 MG tablet Take 1-2 tablets by mouth every 4 (four) hours as needed for up to 3 days for severe pain. 02/12/17 02/15/17  Trace Wirick C, PA-C  methocarbamol (ROBAXIN) 750 MG tablet Take 750 mg by mouth every 8 (eight) hours as needed for muscle spasms.    [provider]  naproxen (NAPROSYN) 500 MG tablet Take 1 tablet (500 mg total) by mouth 2 (two) times daily. 09/10/15   Cheri Fowlerose, Kayla, PA-C    Family History Family History  Problem Relation Age of Onset  . Hypertension Mother   . Diabetes Mother     Social History Social History   Tobacco Use  . Smoking status: Current Some Day Smoker    Packs/day: 0.50    Types: Cigarettes  . Smokeless tobacco: Never Used  Substance Use Topics  . Alcohol use: No  . Drug use: No     Allergies   Patient has no known allergies.   Review of Systems Review of Systems  Constitutional: Negative for chills and fever.  HENT: Positive for ear pain. Negative for congestion, facial swelling and sore throat.   Eyes: Positive for photophobia. Negative for pain and visual disturbance.  Respiratory: Negative for cough and shortness of breath.  Cardiovascular: Negative for chest pain and palpitations.  Gastrointestinal: Negative for abdominal pain, nausea and vomiting.  Musculoskeletal: Positive for neck pain. Negative for arthralgias and back pain.  Skin: Negative for color change and rash.  Neurological: Positive for numbness and headaches. Negative for dizziness, syncope, speech difficulty, weakness and light-headedness.  All other systems reviewed and are negative.    Physical Exam Triage Vital Signs ED Triage Vitals  Enc Vitals Group     BP 02/12/17 1447 118/69     Pulse Rate 02/12/17 1444 70     Resp 02/12/17 1444 16     Temp 02/12/17 1444 98.5 F (36.9 C)     Temp Source 02/12/17 1444 Oral     SpO2 02/12/17 1444 100 %     Weight  02/12/17 1446 250 lb (113.4 kg)     Height --      Head Circumference --      Peak Flow --      Pain Score 02/12/17 1446 10     Pain Loc --      Pain Edu? --      Excl. in GC? --    No data found.  Updated Vital Signs BP 118/69   Pulse 70   Temp 98.5 F (36.9 C) (Oral)   Resp 16   Wt 250 lb (113.4 kg)   LMP 01/30/2017   SpO2 100%   BMI 42.91 kg/m   Visual Acuity Right Eye Distance:  20/20  Left Eye Distance:  20/20  Bilateral Distance:  20/15   Physical Exam  Constitutional: She is oriented to person, place, and time. She appears well-developed and well-nourished. No distress.  Patient with tears due to pain  HENT:  Head: Normocephalic and atraumatic.  Right Ear: Tympanic membrane and ear canal normal.  Left Ear: Tympanic membrane and ear canal normal.  Nose: Nose normal.  Mouth/Throat: Uvula is midline, oropharynx is clear and moist and mucous membranes are normal. No oral lesions. No trismus in the jaw. Abnormal dentition. No uvula swelling.    Last bottom molar on left fractured with evidence of decay, mild buccal mucosal swelling in immediate area. Near chipped tooth. No tenderness to palpation of gum around tooth.  No evidence of facial swelling or erythema.   Eyes: Conjunctivae and EOM are normal. Pupils are equal, round, and reactive to light.  Neck: Normal range of motion. Neck supple.  Cardiovascular: Normal rate and regular rhythm.  No murmur heard. Pulmonary/Chest: Effort normal and breath sounds normal. No respiratory distress. She has no wheezes.  Abdominal: Soft. There is no tenderness.  Musculoskeletal: She exhibits no edema.  Neurological: She is alert and oriented to person, place, and time. She displays normal reflexes. No cranial nerve deficit or sensory deficit. Coordination normal.  Skin: Skin is warm and dry.  Psychiatric: She has a normal mood and affect.  Nursing note and vitals reviewed.    UC Treatments / Results  Labs (all labs  ordered are listed, but only abnormal results are displayed) Labs Reviewed - No data to display  EKG  EKG Interpretation None       Radiology No results found.  Procedures Procedures (including critical care time)  Medications Ordered in UC Medications  ketorolac (TORADOL) injection 60 mg (60 mg Intramuscular Given 02/12/17 1537)  metoCLOPramide (REGLAN) injection 5 mg (5 mg Intramuscular Given 02/12/17 1538)  dexamethasone (DECADRON) injection 10 mg (10 mg Intramuscular Given 02/12/17 1537)     Initial Impression / Assessment and  Plan / UC Course  I have reviewed the triage vital signs and the nursing notes.  Pertinent labs & imaging results that were available during my care of the patient were reviewed by me and considered in my medical decision making (see chart for details).     Patient with severe headache likely stemming from broken tooth on same side as pain. No evidence of ear infection. Does not appear to be stroke related- normal strength, coordination, no focal neuro deficit. Headache medications given in clinic today with some relief. Augmentin for possible infection, 3 days of norco for severe pain. Advised Ibuprofen/tylenol for mild-moderate. Follow up with dentistry.   Final Clinical Impressions(s) / UC Diagnoses   Final diagnoses:  Facial pain    ED Discharge Orders        Ordered    amoxicillin-clavulanate (AUGMENTIN) 875-125 MG tablet  Every 12 hours     02/12/17 1519    HYDROcodone-acetaminophen (NORCO/VICODIN) 5-325 MG tablet  Every 4 hours PRN     02/12/17 1519    fluconazole (DIFLUCAN) 150 MG tablet   Once     02/12/17 1522       Controlled Substance Prescriptions [place] Controlled Substance Registry consulted? Yes, I have consulted the [place] Controlled Substances Registry for this patient, and feel the risk/benefit ratio today is favorable for proceeding with this prescription for a controlled substance.   Jastin Fore, [place] C, PA-C 02/12/17 2246      Patterson Hammersmith C, PA-C 02/12/17 2246

## 2017-02-12 NOTE — ED Triage Notes (Signed)
PT reports left sided facial pain for five days. PT is unsure if pain stems from ear or dental problem.

## 2017-02-13 ENCOUNTER — Emergency Department (HOSPITAL_COMMUNITY)
Admission: EM | Admit: 2017-02-13 | Discharge: 2017-02-14 | Disposition: A | Discharge status: Home / Self Care | Payer: Self-pay | Attending: Emergency Medicine | Admitting: Emergency Medicine

## 2017-02-13 ENCOUNTER — Other Ambulatory Visit: Payer: Self-pay

## 2017-02-13 ENCOUNTER — Encounter (HOSPITAL_COMMUNITY): Payer: Self-pay | Admitting: Emergency Medicine

## 2017-02-13 DIAGNOSIS — F17210 Nicotine dependence, cigarettes, uncomplicated: Secondary | ICD-10-CM | POA: Insufficient documentation

## 2017-02-13 DIAGNOSIS — K0889 Other specified disorders of teeth and supporting structures: Secondary | ICD-10-CM | POA: Insufficient documentation

## 2017-02-13 DIAGNOSIS — L299 Pruritus, unspecified: Secondary | ICD-10-CM | POA: Insufficient documentation

## 2017-02-13 NOTE — ED Provider Notes (Signed)
[facility]EMERGENCY DEPT Provider Note   CSN: [id number] Arrival date & time: 02/13/17  1834     History   Chief Complaint Chief Complaint  Patient presents with  . Dental Pain  . Allergic Reaction    HPI ELLIETTE SEABOLT is a 37 y.o. female. Chief complaint mouth pain  HPI per triage note patient reports pain in the left side of her mouth due to dental infection. States she was seen in urgent care see given medication. His itching. Feels she may be reaction to the medication.  Past Medical History:  Diagnosis Date  . Medical history non-contributory     There are no active problems to display for this patient.   Past Surgical History:  Procedure Laterality Date  . NO PAST SURGERIES      OB History    Gravida Para Term Preterm AB Living   1 1 1     1    SAB TAB Ectopic Multiple Live Births                   Home Medications    Prior to Admission medications   Medication Sig Start Date End Date Taking? Authorizing Provider  amoxicillin-clavulanate (AUGMENTIN) 875-125 MG tablet Take 1 tablet by mouth every 12 (twelve) hours for 5 days. 02/12/17 02/17/17  Wieters, Hallie C, PA-C  aspirin-acetaminophen-caffeine (EXCEDRIN MIGRAINE) [phone] MG per tablet Take 2 tablets by mouth every 6 (six) hours as needed for headache or migraine.    [provider]  cephALEXin (KEFLEX) 500 MG capsule Take 1 capsule (500 mg total) by mouth 4 (four) times daily. 09/10/15   Cheri Fowler, PA-C  cyclobenzaprine (FLEXERIL) 10 MG tablet Take 1 tablet (10 mg total) by mouth 2 (two) times daily as needed for muscle spasms. 05/15/14   Oswaldo Conroy, PA-C  HYDROcodone-acetaminophen (NORCO/VICODIN) 5-325 MG tablet Take 1-2 tablets by mouth every 4 (four) hours as needed for up to 3 days for severe pain. 02/12/17 02/15/17  Wieters, Hallie C, PA-C  methocarbamol (ROBAXIN) 750 MG tablet Take 750 mg by mouth every 8 (eight) hours as needed for muscle spasms.    [provider]  naproxen (NAPROSYN) 500 MG tablet Take 1 tablet (500 mg total) by mouth 2 (two) times daily. 09/10/15   Cheri Fowler, PA-C    Family History Family History  Problem Relation Age of Onset  . Hypertension Mother   . Diabetes Mother     Social History Social History   Tobacco Use  . Smoking status: Current Some Day Smoker    Packs/day: 0.50    Types: Cigarettes  . Smokeless tobacco: Never Used  Substance Use Topics  . Alcohol use: No  . Drug use: No     Allergies   Patient has no known allergies.   Review of Systems Review of Systems  Unable to perform ROS: Other  Patient refuses my questioning   Physical Exam Updated Vital Signs BP 124/74 (BP Location: Left Arm)   Pulse 84   Temp 98.3 F (36.8 C) (Oral)   Resp 18   Ht 5' 4.5" (1.638 m)   Wt 122 kg (269 lb)   LMP 01/30/2017 (Exact Date)   SpO2 100%   BMI 45.46 kg/m   Physical Exam Upon entering the room patient is immediately abusive, profane, regarding her weight. Indeed she has been her 3 hours tonight. My immediate response per my normal stool just for weight and offered to examine her. She refuses.  She continues to be abusive to myself and staff verbally. She has some obvious swelling adjacent to mandible externally. She'll not allow me to examine the interior of her mouth. She is not drooling she has a clear voice as evidenced by her boisterous profanity.  ED Treatments / Results  Labs (all labs ordered are listed, but only abnormal results are displayed) Labs Reviewed - No data to display  EKG  EKG Interpretation None       Radiology No results found.  Procedures Procedures (including critical care time)  Medications Ordered in ED Medications - No data to display   Initial Impression / Assessment and Plan / ED Course  I have reviewed the triage vital signs and the nursing notes.  Pertinent labs & imaging results that were available during my care of the patient were reviewed  by me and considered in my medical decision making (see chart for details).    Patient stands up and leaves the room. This completes the medical screening exam.  Final Clinical Impressions(s) / ED Diagnoses   Final diagnoses:  Pain, dental    ED Discharge Orders    None       Rolland PorterJames, Kayleana Waites, MD 02/13/17 2144

## 2017-02-13 NOTE — ED Triage Notes (Signed)
Pt reports having increasing pain on left side of mouth due to dental pain and was seen at urgent care on 02/13/16 and given medication for pain and antibiotics. Pt now states that she was having itching all over that started earlier to day. Pt reports not taking any other medication to help with itching. No acute respiratory distress noted at this time. Swelling to left side of mouth noted.

## 2017-02-14 ENCOUNTER — Encounter (HOSPITAL_COMMUNITY): Payer: Self-pay | Admitting: Emergency Medicine

## 2017-02-14 ENCOUNTER — Emergency Department (HOSPITAL_COMMUNITY)
Admission: EM | Admit: 2017-02-14 | Discharge: 2017-02-14 | Disposition: A | Discharge status: Home / Self Care | Payer: Self-pay | Attending: Emergency Medicine | Admitting: Emergency Medicine

## 2017-02-14 DIAGNOSIS — F17210 Nicotine dependence, cigarettes, uncomplicated: Secondary | ICD-10-CM | POA: Insufficient documentation

## 2017-02-14 DIAGNOSIS — K047 Periapical abscess without sinus: Secondary | ICD-10-CM | POA: Insufficient documentation

## 2017-02-14 DIAGNOSIS — Z79899 Other long term (current) drug therapy: Secondary | ICD-10-CM | POA: Insufficient documentation

## 2017-02-14 MED ORDER — IBUPROFEN 600 MG PO TABS: 600.0000 mg | ORAL_TABLET | Freq: Four times a day (QID) | ORAL | 0 refills | Status: DC | PRN

## 2017-02-14 MED ORDER — IBUPROFEN 400 MG PO TABS
800.0000 mg | ORAL_TABLET | Freq: Once | ORAL | Status: AC
  Administered 2017-02-14: 800 mg via ORAL
  Filled 2017-02-14: qty 2

## 2017-02-14 NOTE — Discharge Instructions (Signed)
Call your Dentist on Monday to schedule an appointment for recheck of dental abscess. REturn here with any high fever, difficulty swallowing or new concern.

## 2017-02-14 NOTE — ED Provider Notes (Signed)
[facility] Provider Note   CSN: [id number] Arrival date & time: 02/14/17  0134     History   Chief Complaint Chief Complaint  Patient presents with  . Dental Pain    HPI Virginia Ferguson is a 37 y.o. female.  The patient presents for further evaluation of severe dental pain, now with facial swelling. No fever, nausea or difficulty swallowing. She was seen in this department yesterday but reports the swelling of the left side of her face started after she got home and she became concerned. She was started on Augmentin yesterday and has had one dose but has the prescription and will be compliant.    The history is provided by the patient. No language interpreter was used.  Dental Pain      Past Medical History:  Diagnosis Date  . Medical history non-contributory     There are no active problems to display for this patient.   Past Surgical History:  Procedure Laterality Date  . NO PAST SURGERIES      OB History    Gravida Para Term Preterm AB Living   1 1 1     1    SAB TAB Ectopic Multiple Live Births                   Home Medications    Prior to Admission medications   Medication Sig Start Date End Date Taking? Authorizing Provider  amoxicillin-clavulanate (AUGMENTIN) 875-125 MG tablet Take 1 tablet by mouth every 12 (twelve) hours for 5 days. 02/12/17 02/17/17  Wieters, Hallie C, PA-C  aspirin-acetaminophen-caffeine (EXCEDRIN MIGRAINE) [phone] MG per tablet Take 2 tablets by mouth every 6 (six) hours as needed for headache or migraine.    [provider]  cephALEXin (KEFLEX) 500 MG capsule Take 1 capsule (500 mg total) by mouth 4 (four) times daily. 09/10/15   Cheri Fowlerose, Kayla, PA-C  cyclobenzaprine (FLEXERIL) 10 MG tablet Take 1 tablet (10 mg total) by mouth 2 (two) times daily as needed for muscle spasms. 05/15/14   Oswaldo Conroyreech, Victoria, PA-C  HYDROcodone-acetaminophen (NORCO/VICODIN) 5-325 MG tablet Take 1-2 tablets by mouth  every 4 (four) hours as needed for up to 3 days for severe pain. 02/12/17 02/15/17  Wieters, Hallie C, PA-C  ibuprofen (ADVIL,MOTRIN) 600 MG tablet Take 1 tablet (600 mg total) by mouth every 6 (six) hours as needed. 02/14/17   Elpidio AnisUpstill, Destiny Hagin, PA-C  methocarbamol (ROBAXIN) 750 MG tablet Take 750 mg by mouth every 8 (eight) hours as needed for muscle spasms.    [provider]  naproxen (NAPROSYN) 500 MG tablet Take 1 tablet (500 mg total) by mouth 2 (two) times daily. 09/10/15   Cheri Fowlerose, Kayla, PA-C    Family History Family History  Problem Relation Age of Onset  . Hypertension Mother   . Diabetes Mother     Social History Social History   Tobacco Use  . Smoking status: Current Some Day Smoker    Packs/day: 0.50    Types: Cigarettes  . Smokeless tobacco: Never Used  Substance Use Topics  . Alcohol use: No  . Drug use: No     Allergies   Patient has no known allergies.   Review of Systems Review of Systems  Constitutional: Negative for fever.  HENT: Positive for dental problem and facial swelling. Negative for trouble swallowing.   Gastrointestinal: Negative for nausea.  Musculoskeletal: Negative for neck pain.     Physical Exam Updated Vital Signs BP 131/71 (  BP Location: Right Arm)   Pulse 88   Temp 99.2 F (37.3 C) (Oral)   Resp 18   Ht 5\' 5"  (1.651 m)   Wt 122 kg (269 lb)   LMP 01/30/2017 (Exact Date)   SpO2 100%   BMI 44.76 kg/m   Physical Exam  Constitutional: She is oriented to person, place, and time. She appears well-developed and well-nourished.  HENT:  Mouth/Throat: Oropharynx is clear and moist.  Marked left facial swelling. Overall good dentition without significant caries. No visualized apical abscess.   Neck: Normal range of motion.  Pulmonary/Chest: Effort normal.  Lymphadenopathy:    She has no cervical adenopathy.  Neurological: She is alert and oriented to person, place, and time.  Skin: Skin is warm and dry.     ED Treatments /  Results  Labs (all labs ordered are listed, but only abnormal results are displayed) Labs Reviewed - No data to display  EKG  EKG Interpretation None       Radiology No results found.  Procedures Procedures (including critical care time)  Medications Ordered in ED Medications  ibuprofen (ADVIL,MOTRIN) tablet 800 mg (800 mg Oral Given 02/14/17 0344)     Initial Impression / Assessment and Plan / ED Course  I have reviewed the triage vital signs and the nursing notes.  Pertinent labs & imaging results that were available during my care of the patient were reviewed by me and considered in my medical decision making (see chart for details).     Patient with dental abscess. No fever or compromise of airway. Facial swelling felt to be secondary to dental abscess. She was started on Augmentin but has only had 1 or 2 doses, likely not enough to see results or improvement. Patient reassured. Ibuprofen added to her regimen. Dental referral provided.   Final Clinical Impressions(s) / ED Diagnoses   Final diagnoses:  Dental abscess    ED Discharge Orders        Ordered    ibuprofen (ADVIL,MOTRIN) 600 MG tablet  Every 6 hours PRN     02/14/17 0328       Elpidio Anis, PA-C 02/14/17 0457    Dione Booze, MD 02/14/17 [phone]

## 2017-02-14 NOTE — ED Triage Notes (Signed)
Pt reports increased left sided mouth pain and swelling. Seen previously for same, given prescriptions for pain and antibiotics.

## 2017-02-14 NOTE — ED Notes (Signed)
Pt verbalized understanding of d/c instructions and has no further questions. Pt is stable, A&Ox4, VSS.  

## 2017-03-08 ENCOUNTER — Encounter (HOSPITAL_COMMUNITY): Payer: Self-pay | Admitting: Family Medicine

## 2017-03-08 ENCOUNTER — Other Ambulatory Visit: Payer: Self-pay

## 2017-03-08 ENCOUNTER — Ambulatory Visit (HOSPITAL_COMMUNITY)
Admission: EM | Admit: 2017-03-08 | Discharge: 2017-03-08 | Disposition: A | Discharge status: Home / Self Care | Payer: Self-pay | Attending: Family Medicine | Admitting: Family Medicine

## 2017-03-08 DIAGNOSIS — K047 Periapical abscess without sinus: Secondary | ICD-10-CM

## 2017-03-08 MED ORDER — HYDROCODONE-ACETAMINOPHEN 5-325 MG PO TABS: 1.0000 | ORAL_TABLET | Freq: Four times a day (QID) | ORAL | 0 refills | Status: DC | PRN

## 2017-03-08 MED ORDER — AMOXICILLIN 875 MG PO TABS: 875.0000 mg | ORAL_TABLET | Freq: Two times a day (BID) | ORAL | 0 refills | Status: DC

## 2017-03-08 NOTE — ED Triage Notes (Addendum)
[facility] to [facility] for [facility] pain x3 days, left side lower back

## 2017-03-08 NOTE — ED Provider Notes (Signed)
[facility]   [id number] 03/08/17 Arrival Time: 1842   SUBJECTIVE:  Virginia Ferguson is a 37 y.o. female who presents to the urgent care with complaint of dental pain for several days.  Her dentist told her that the earliest he could see her was March.     Past Medical History:  Diagnosis Date  . Medical history non-contributory    Family History  Problem Relation Age of Onset  . Hypertension Mother   . Diabetes Mother    Social History   Socioeconomic History  . Marital status: Single    Spouse name: Not on file  . Number of children: Not on file  . Years of education: Not on file  . Highest education level: Not on file  Social Needs  . Financial resource strain: Not on file  . Food insecurity - worry: Not on file  . Food insecurity - inability: Not on file  . Transportation needs - medical: Not on file  . Transportation needs - non-medical: Not on file  Occupational History  . Not on file  Tobacco Use  . Smoking status: Current Some Day Smoker    Packs/day: 0.50    Types: Cigarettes  . Smokeless tobacco: Never Used  Substance and Sexual Activity  . Alcohol use: Yes    Comment: occasional  . Drug use: No  . Sexual activity: Yes    Birth control/protection: None  Other Topics Concern  . Not on file  Social History Narrative  . Not on file   No outpatient medications have been marked as taking for the 03/08/17 encounter [facility](Hospital Encounter).   No Known Allergies    ROS: As per HPI, remainder of ROS negative.   OBJECTIVE:   Vitals:   03/08/17 2007  BP: (!) 159/101  Pulse: 100  Resp: 18  Temp: 98.1 F (36.7 C)  TempSrc: Oral  SpO2: 100%     General appearance: alert; no distress Eyes: PERRL; EOMI; conjunctiva normal HENT: normocephalic; atraumatic;Tooth #18 is carious.  TMs are normal Neck: supple; no adenopathy in the neck Back: no CVA tenderness Extremities: no cyanosis or edema; symmetrical with no gross deformities Skin: warm  and dry Neurologic: normal gait; grossly normal Psychological: alert and cooperative; normal mood and affect      Labs:  Results for orders placed or performed during the hospital encounter of 11/28/14  Urine culture  Result Value Ref Range   Specimen Description URINE, RANDOM    Special Requests NONE    Culture      >=100,000 COLONIES/mL ESCHERICHIA COLI [facility]formed [facility]    Report Status 12/01/2014 FINAL    Organism ID, Bacteria ESCHERICHIA COLI       Susceptibility   Escherichia coli - MIC*    AMPICILLIN 16 INTERMEDIATE Intermediate     CEFAZOLIN <=4 SENSITIVE Sensitive     CEFTRIAXONE <=1 SENSITIVE Sensitive     CIPROFLOXACIN <=0.25 SENSITIVE Sensitive     GENTAMICIN <=1 SENSITIVE Sensitive     IMIPENEM 1 SENSITIVE Sensitive     NITROFURANTOIN <=16 SENSITIVE Sensitive     TRIMETH/SULFA <=20 SENSITIVE Sensitive     AMPICILLIN/SULBACTAM 4 INTERMEDIATE Intermediate     PIP/TAZO <=4 SENSITIVE Sensitive     * >=100,000 COLONIES/mL ESCHERICHIA COLI  Comprehensive metabolic panel  Result Value Ref Range   Sodium 137 135 - 145 mmol/L   Potassium 3.1 (L) 3.5 - 5.1 mmol/L   Chloride 105 101 - 111 mmol/L   CO2 25  22 - 32 mmol/L   Glucose, Bld 101 (H) 65 - 99 mg/dL   BUN 5 (L) 6 - 20 mg/dL   Creatinine, Ser 1.61 0.44 - 1.00 mg/dL   Calcium 8.9 8.9 - 09.6 mg/dL   Total Protein 7.2 6.5 - 8.1 g/dL   Albumin 3.6 3.5 - 5.0 g/dL   AST 28 15 - 41 U/L   ALT 13 (L) 14 - 54 U/L   Alkaline Phosphatase 87 38 - 126 U/L   Total Bilirubin 0.4 0.3 - 1.2 mg/dL   GFR calc non Af Amer >60 >60 mL/min   GFR calc Af Amer >60 >60 mL/min   Anion gap 7 5 - 15  Urinalysis, Routine w reflex microscopic (not at [facility])  Result Value Ref Range   Color, Urine YELLOW YELLOW   APPearance CLOUDY (A) CLEAR   Specific Gravity, Urine 1.017 1.005 - 1.030   pH 7.5 5.0 - 8.0   Glucose, UA NEGATIVE NEGATIVE mg/dL   Hgb urine dipstick NEGATIVE NEGATIVE   Bilirubin Urine NEGATIVE NEGATIVE    Ketones, ur NEGATIVE NEGATIVE mg/dL   Protein, ur NEGATIVE NEGATIVE mg/dL   Urobilinogen, UA 1.0 0.0 - 1.0 mg/dL   Nitrite POSITIVE (A) NEGATIVE   Leukocytes, UA SMALL (A) NEGATIVE  CBC with Differential  Result Value Ref Range   WBC 6.2 4.0 - 10.5 K/uL   RBC 3.89 3.87 - 5.11 MIL/uL   Hemoglobin 8.7 (L) 12.0 - 15.0 g/dL   HCT 04.5 (L) 40.9 - 81.1 %   MCV 72.2 (L) 78.0 - 100.0 fL   MCH 22.4 (L) 26.0 - 34.0 pg   MCHC 31.0 30.0 - 36.0 g/dL   RDW 91.4 (H) 78.2 - 95.6 %   Platelets 247 150 - 400 K/uL   Neutrophils Relative % 73 %   Neutro Abs 4.5 1.7 - 7.7 K/uL   Lymphocytes Relative 14 %   Lymphs Abs 0.8 0.7 - 4.0 K/uL   Monocytes Relative 12 %   Monocytes Absolute 0.7 0.1 - 1.0 K/uL   Eosinophils Relative 1 %   Eosinophils Absolute 0.1 0.0 - 0.7 K/uL   Basophils Relative 0 %   Basophils Absolute 0.0 0.0 - 0.1 K/uL  Urine microscopic-add on  Result Value Ref Range   Squamous Epithelial / LPF FEW (A) RARE   WBC, UA 21-50 <3 WBC/hpf   Bacteria, UA MANY (A) RARE    Labs Reviewed - No data to display  No results found.     ASSESSMENT & PLAN:  1. Dental infection     Meds ordered this encounter  Medications  . amoxicillin (AMOXIL) 875 MG tablet    Sig: Take 1 tablet (875 mg total) by mouth 2 (two) times daily.    Dispense:  20 tablet    Refill:  0  . HYDROcodone-acetaminophen (NORCO) 5-325 MG tablet    Sig: Take 1 tablet by mouth every 6 (six) hours as needed for moderate pain.    Dispense:  8 tablet    Refill:  0    Reviewed expectations re: course of current medical issues. Questions answered. Outlined signs and symptoms indicating need for more acute intervention. Patient verbalized understanding. After Visit Summary given.    Procedures:      Elvina Sidle, MD 03/08/17 2011

## 2017-03-08 NOTE — Discharge Instructions (Signed)
We are not a dental clinic so we cannot solve this problem.  You must see a dentist.  Please refer to the list of resources included in this packet

## 2017-06-05 ENCOUNTER — Ambulatory Visit (HOSPITAL_COMMUNITY)
Admission: EM | Admit: 2017-06-05 | Discharge: 2017-06-05 | Disposition: A | Discharge status: Home / Self Care | Payer: Medicaid Other

## 2017-06-05 ENCOUNTER — Encounter (HOSPITAL_COMMUNITY): Payer: Self-pay

## 2017-06-05 ENCOUNTER — Emergency Department (HOSPITAL_COMMUNITY): Payer: Self-pay

## 2017-06-05 ENCOUNTER — Emergency Department (HOSPITAL_COMMUNITY)
Admission: EM | Admit: 2017-06-05 | Discharge: 2017-06-05 | Disposition: A | Discharge status: Home / Self Care | Payer: Self-pay | Attending: Emergency Medicine | Admitting: Emergency Medicine

## 2017-06-05 DIAGNOSIS — N12 Tubulo-interstitial nephritis, not specified as acute or chronic: Secondary | ICD-10-CM | POA: Insufficient documentation

## 2017-06-05 DIAGNOSIS — F17210 Nicotine dependence, cigarettes, uncomplicated: Secondary | ICD-10-CM | POA: Insufficient documentation

## 2017-06-05 LAB — URINALYSIS, ROUTINE W REFLEX MICROSCOPIC
BILIRUBIN URINE: NEGATIVE
Glucose, UA: NEGATIVE mg/dL
KETONES UR: 5 mg/dL — AB
NITRITE: POSITIVE — AB
PH: 5 (ref 5.0–8.0)
Protein, ur: 100 mg/dL — AB
Specific Gravity, Urine: 1.016 (ref 1.005–1.030)
WBC, UA: >50 WBC/hpf — ABNORMAL HIGH (ref 0–5)

## 2017-06-05 LAB — COMPREHENSIVE METABOLIC PANEL
ALT: 17 U/L (ref 14–54)
AST: 24 U/L (ref 15–41)
Albumin: 3.5 g/dL (ref 3.5–5.0)
Alkaline Phosphatase: 97 U/L (ref 38–126)
Anion gap: 11 (ref 5–15)
BUN: 5 mg/dL — ABNORMAL LOW (ref 6–20)
CO2: 24 mmol/L (ref 22–32)
Calcium: 10.7 mg/dL — ABNORMAL HIGH (ref 8.9–10.3)
Chloride: 99 mmol/L — ABNORMAL LOW (ref 101–111)
Creatinine, Ser: 0.7 mg/dL (ref 0.44–1.00)
Glucose, Bld: 108 mg/dL — ABNORMAL HIGH (ref 65–99)
POTASSIUM: 3.4 mmol/L — AB (ref 3.5–5.1)
Sodium: 134 mmol/L — ABNORMAL LOW (ref 135–145)
Total Bilirubin: 0.4 mg/dL (ref 0.3–1.2)
Total Protein: 7.8 g/dL (ref 6.5–8.1)

## 2017-06-05 LAB — CBC
HEMATOCRIT: 30.6 % — AB (ref 36.0–46.0)
Hemoglobin: 9.1 g/dL — ABNORMAL LOW (ref 12.0–15.0)
MCH: 21.1 pg — ABNORMAL LOW (ref 26.0–34.0)
MCHC: 29.7 g/dL — ABNORMAL LOW (ref 30.0–36.0)
MCV: 70.8 fL — ABNORMAL LOW (ref 78.0–100.0)
PLATELETS: 203 10*3/uL (ref 150–400)
RBC: 4.32 MIL/uL (ref 3.87–5.11)
RDW: 18.4 % — ABNORMAL HIGH (ref 11.5–15.5)
WBC: 10.9 10*3/uL — AB (ref 4.0–10.5)

## 2017-06-05 LAB — I-STAT CG4 LACTIC ACID, ED: Lactic Acid, Venous: 1.42 mmol/L (ref 0.5–1.9)

## 2017-06-05 LAB — PREGNANCY, URINE: Preg Test, Ur: NEGATIVE

## 2017-06-05 LAB — LIPASE, BLOOD: LIPASE: 20 U/L (ref 11–51)

## 2017-06-05 LAB — I-STAT BETA HCG BLOOD, ED (MC, WL, AP ONLY): I-stat hCG, quantitative: 5.3 m[IU]/mL — ABNORMAL HIGH (ref <5)

## 2017-06-05 MED ORDER — SODIUM CHLORIDE 0.9 % IV BOLUS
1000.0000 mL | Freq: Once | INTRAVENOUS | Status: AC
Start: 2017-06-05 — End: 2017-06-05
  Administered 2017-06-05: 1000 mL via INTRAVENOUS

## 2017-06-05 MED ORDER — ACETAMINOPHEN 325 MG PO TABS
650.0000 mg | ORAL_TABLET | Freq: Once | ORAL | Status: AC
  Administered 2017-06-05: 650 mg via ORAL
  Filled 2017-06-05: qty 2

## 2017-06-05 MED ORDER — SODIUM CHLORIDE 0.9 % IV SOLN
1.0000 g | Freq: Once | INTRAVENOUS | Status: AC
  Administered 2017-06-05: 1 g via INTRAVENOUS
  Filled 2017-06-05: qty 10

## 2017-06-05 MED ORDER — ONDANSETRON 4 MG PO TBDP: 4.0000 mg | ORAL_TABLET | Freq: Three times a day (TID) | ORAL | 0 refills | Status: DC | PRN

## 2017-06-05 MED ORDER — MORPHINE SULFATE (PF) 4 MG/ML IV SOLN
4.0000 mg | Freq: Once | INTRAVENOUS | Status: AC
  Administered 2017-06-05: 4 mg via INTRAVENOUS
  Filled 2017-06-05: qty 1

## 2017-06-05 MED ORDER — CEPHALEXIN 500 MG PO CAPS: 500.0000 mg | ORAL_CAPSULE | Freq: Four times a day (QID) | ORAL | 0 refills | Status: AC

## 2017-06-05 MED ORDER — ACETAMINOPHEN 325 MG PO TABS
650.0000 mg | ORAL_TABLET | Freq: Once | ORAL | Status: AC
  Administered 2017-06-05: 650 mg via ORAL

## 2017-06-05 MED ORDER — OXYCODONE HCL 5 MG PO TABS: 5.0000 mg | ORAL_TABLET | Freq: Four times a day (QID) | ORAL | 0 refills | Status: AC | PRN

## 2017-06-05 MED ORDER — MORPHINE SULFATE (PF) 4 MG/ML IV SOLN
6.0000 mg | Freq: Once | INTRAVENOUS | Status: AC
Start: 2017-06-05 — End: 2017-06-05
  Administered 2017-06-05: 6 mg via INTRAVENOUS
  Filled 2017-06-05: qty 2

## 2017-06-05 MED ORDER — FENTANYL CITRATE (PF) 100 MCG/2ML IJ SOLN
50.0000 ug | Freq: Once | INTRAMUSCULAR | Status: AC
  Administered 2017-06-05: 50 ug via INTRAVENOUS
  Filled 2017-06-05: qty 2

## 2017-06-05 MED ORDER — ONDANSETRON HCL 4 MG/2ML IJ SOLN
4.0000 mg | Freq: Once | INTRAMUSCULAR | Status: AC
  Administered 2017-06-05: 4 mg via INTRAVENOUS
  Filled 2017-06-05: qty 2

## 2017-06-05 NOTE — ED Notes (Signed)
RN reported patient's continued elevated  Temp; New orders placed; EDP at bedside to reevaluate; Pt will be moved to Hallway bed for temporary observation-Monique,RN

## 2017-06-05 NOTE — ED Notes (Signed)
Patient requesting more pain medication. PA made aware. 

## 2017-06-05 NOTE — ED Notes (Signed)
Pt back from CT

## 2017-06-05 NOTE — ED Notes (Signed)
CT made aware of negative urine preg result

## 2017-06-05 NOTE — Discharge Instructions (Addendum)
You have a kidney infection. Please take antibiotics until completed. For pain take 1000 mg acetaminophen (tylenol) plus 600 mg ibuprofen every 6 hours.  Take oxycodone 5 mg for break through or more severe pain. Zofran for nausea.   Return for persistent fevers, vomiting, decreased urine output, worsening pain despite antibiotics.

## 2017-06-05 NOTE — ED Triage Notes (Signed)
PT sent over from [facility] with LUQ pain that radiates to back x 4 days with temp of 103. Pt endorses nausea. Denies vomiting or diarrhea.

## 2017-06-05 NOTE — ED Triage Notes (Signed)
Spoke with Dr. Milus Glazier about pt symptoms, pt in severe pain, with abnormal vital signs pt needs to go to the ER. Pt aggreable to plan.

## 2017-06-05 NOTE — ED Notes (Signed)
Patient requesting pain medication-PA made aware. 

## 2017-06-05 NOTE — ED Provider Notes (Signed)
[facility] EMERGENCY DEPARTMENT Provider Note   CSN: [id number] Arrival date & time: 06/05/17  1314     History   Chief Complaint Chief Complaint  Patient presents with  . Abdominal Pain    HPI Virginia Ferguson is a 37 y.o. female with history of tobacco use here for evaluation of abdominal pain 4 days. Pain is described as sharp, constant, 10/10 that begins epigastric region and radiates across to left flank. Associated symptoms include fever, headache, nausea, darker urine. States taking deep breaths makes her abdominal/flank pain worse. No interventions PTA. No alleviating factors. Aggravating factors include taking deep breaths and direct palpation. Last menstrual period 05/23/17. She denies chest pain, shortness of breath, cough, vomiting, dysuria, frequency, urgency, constipation, diarrhea, abnormal vaginal discharge or bleeding.  HPI  Past Medical History:  Diagnosis Date  . Medical history non-contributory     There are no active problems to display for this patient.   Past Surgical History:  Procedure Laterality Date  . NO PAST SURGERIES       OB History    Gravida  1   Para  1   Term  1   Preterm      AB      Living  1     SAB      TAB      Ectopic      Multiple      Live Births               Home Medications    Prior to Admission medications   Medication Sig Start Date End Date Taking? Authorizing Provider  aspirin-acetaminophen-caffeine (EXCEDRIN MIGRAINE) [phone] MG tablet Take 1 tablet by mouth every 6 (six) hours as needed for headache or migraine.   Yes [provider]  amoxicillin (AMOXIL) 875 MG tablet Take 1 tablet (875 mg total) by mouth 2 (two) times daily. Patient not taking: Reported on 06/05/2017 03/08/17   Elvina Sidle, MD  cephALEXin (KEFLEX) 500 MG capsule Take 1 capsule (500 mg total) by mouth 4 (four) times daily for 14 days. 06/05/17 06/19/17  Liberty Handy, PA-C    HYDROcodone-acetaminophen (NORCO) 5-325 MG tablet Take 1 tablet by mouth every 6 (six) hours as needed for moderate pain. Patient not taking: Reported on 06/05/2017 03/08/17   Elvina Sidle, MD  ondansetron (ZOFRAN ODT) 4 MG disintegrating tablet Take 1 tablet (4 mg total) by mouth every 8 (eight) hours as needed for nausea or vomiting. 06/05/17   Liberty Handy, PA-C  oxyCODONE (ROXICODONE) 5 MG immediate release tablet Take 1 tablet (5 mg total) by mouth every 6 (six) hours as needed for up to 2 days for severe pain. 06/05/17 06/07/17  Liberty Handy, PA-C    Family History Family History  Problem Relation Age of Onset  . Hypertension Mother   . Diabetes Mother     Social History Social History   Tobacco Use  . Smoking status: Current Some Day Smoker    Packs/day: 0.50    Types: Cigarettes  . Smokeless tobacco: Never Used  Substance Use Topics  . Alcohol use: Yes    Comment: occasional  . Drug use: No     Allergies   Patient has no known allergies.   Review of Systems Review of Systems  Constitutional: Positive for appetite change and fever.  Gastrointestinal: Positive for abdominal pain and nausea.  Genitourinary: Positive for flank pain.       Darker urine  Neurological: Positive for headaches.  All other systems reviewed and are negative.    Physical Exam Updated Vital Signs BP 111/62   Pulse 95   Temp 99.6 F (37.6 C) (Oral)   Resp 20   SpO2 100%   Physical Exam  Constitutional: She is oriented to person, place, and time. She appears well-developed and well-nourished. No distress.  Non toxic  HENT:  Head: Normocephalic and atraumatic.  Nose: Nose normal.  Mouth/Throat: No oropharyngeal exudate.  Moist mucous membranes   Eyes: Pupils are equal, round, and reactive to light. Conjunctivae and EOM are normal.  Neck: Normal range of motion.  Cardiovascular: Normal rate, regular rhythm and intact distal pulses.  No murmur heard. 2+ DP and  radial pulses bilaterally. No LE edema.   Pulmonary/Chest: Effort normal and breath sounds normal. No respiratory distress. She has no wheezes. She has no rales.  Abdominal: Soft. Bowel sounds are normal. There is tenderness in the periumbilical area. There is CVA tenderness (left).  Negative Murphy's. Negative McBurney's. No suprapubic tenderness. Soft, nondistended. No G/R/R.   Musculoskeletal: Normal range of motion. She exhibits no deformity.  Neurological: She is alert and oriented to person, place, and time.  Skin: Skin is warm and dry. Capillary refill takes less than 2 seconds.  Psychiatric: She has a normal mood and affect. Her behavior is normal. Judgment and thought content normal.  Nursing note and vitals reviewed.    ED Treatments / Results  Labs (all labs ordered are listed, but only abnormal results are displayed) Labs Reviewed  COMPREHENSIVE METABOLIC PANEL - Abnormal; Notable for the following components:      Result Value   Sodium 134 (*)    Potassium 3.4 (*)    Chloride 99 (*)    Glucose, Bld 108 (*)    BUN 5 (*)    Calcium 10.7 (*)    All other components within normal limits  CBC - Abnormal; Notable for the following components:   WBC 10.9 (*)    Hemoglobin 9.1 (*)    HCT 30.6 (*)    MCV 70.8 (*)    MCH 21.1 (*)    MCHC 29.7 (*)    RDW 18.4 (*)    All other components within normal limits  URINALYSIS, ROUTINE W REFLEX MICROSCOPIC - Abnormal; Notable for the following components:   APPearance HAZY (*)    Hgb urine dipstick SMALL (*)    Ketones, ur 5 (*)    Protein, ur 100 (*)    Nitrite POSITIVE (*)    Leukocytes, UA LARGE (*)    WBC, UA >50 (*)    Bacteria, UA RARE (*)    All other components within normal limits  I-STAT BETA HCG BLOOD, ED ([facility], [facility], [facility] ONLY) - Abnormal; Notable for the following components:   I-stat hCG, quantitative 5.3 (*)    All other components within normal limits  URINE CULTURE  CULTURE, BLOOD (ROUTINE X 2)  CULTURE, BLOOD  (ROUTINE X 2)  LIPASE, BLOOD  PREGNANCY, URINE  I-STAT CG4 LACTIC ACID, ED    EKG None  Radiology Ct Renal Stone Study  Result Date: 06/05/2017 CLINICAL DATA:  Left upper quadrant and epigastric pain as well as mid abdominal pain radiating to left side of back 4 days with fever. EXAM: CT ABDOMEN AND PELVIS WITHOUT CONTRAST TECHNIQUE: Multidetector CT imaging of the abdomen and pelvis was performed following the standard protocol without IV contrast. COMPARISON:  None. FINDINGS: Lower chest: Lung bases are normal.  Hepatobiliary: Normal. Pancreas: Normal. Spleen: Borderline splenomegaly. Adrenals/Urinary Tract: Adrenal glands are within normal. Kidneys are normal in size. There is a punctate nonobstructing stone over the lower pole left kidney. No evidence of hydronephrosis or focal mass. Stomach/Bowel: Stomach and small bowel are normal. Appendix is normal. Colon is unremarkable. Vascular/Lymphatic: Vascular structures are within normal. No significant adenopathy. Reproductive: Within normal. Other: No free fluid or focal inflammatory change. Musculoskeletal: Within normal. IMPRESSION: No acute findings within the abdomen/pelvis. Borderline splenomegaly. Nonobstructing punctate stone over the lower pole collecting system of the left kidney. Electronically Signed   By: Elberta Fortis M.D.   On: 06/05/2017 19:07    Procedures Procedures (including critical care time)  Medications Ordered in ED Medications  acetaminophen (TYLENOL) tablet 650 mg (650 mg Oral Given 06/05/17 1329)  sodium chloride 0.9 % bolus 1,000 mL (0 mLs Intravenous Stopped 06/05/17 1612)  ondansetron (ZOFRAN) injection 4 mg (4 mg Intravenous Given 06/05/17 1509)  morphine 4 MG/ML injection 4 mg (4 mg Intravenous Given 06/05/17 1509)  cefTRIAXone (ROCEPHIN) 1 g in sodium chloride 0.9 % 100 mL IVPB (0 g Intravenous Stopped 06/05/17 1651)  morphine 4 MG/ML injection 6 mg (6 mg Intravenous Given 06/05/17 1612)  fentaNYL (SUBLIMAZE)  injection 50 mcg (50 mcg Intravenous Given 06/05/17 1901)     Initial Impression / Assessment and Plan / ED Course  I have reviewed the triage vital signs and the nursing notes.  Pertinent labs & imaging results that were available during my care of the patient were reviewed by me and considered in my medical decision making (see chart for details).  Clinical Course as of Jun 06 1943  Sat Jun 05, 2017  1442 WBC(!): 10.9 [CG]  1442 Hemoglobin(!): 9.1 [CG]  1443 I-stat hCG, quantitative(!): 5.3 [CG]    Clinical Course User Index [CG] Liberty Handy, PA-C   37 year old female here with epigastric and left flank pain with fever. Differential includes pancreatitis, gastroenteritis, UTI/pyelonephritis, infected kidney stone. On exam, she is initially tachycardic but also febrile. She is nontoxic appearing. Fever and tachycardia resolved after Tylenol. She has exquisite left CVAT. Otherwise well appearing.   Lab work remarkable for mild leukocytosis WBC 10.9. Lactic normal. Urinalysis shows infection. Kidney function WNL. Highest suspicion for pyelonephritis however will obtain CT renal to rule out infected kidney stone. Urine culture and blood culture sent.  Final Clinical Impressions(s) / ED Diagnoses   1945: CT renal without stone, hydronephrosis, otherwise unremarkable. Patient given Rocephin in the ED. Given reassuring exam, improvement in vital signs, patient deemed adequate for discharge with Keflex for pyelonephritis. Discussed return precautions. Patient and mother at bedside agreeable with ED treatment and discharge plan. Blood and urine culture sent. Final diagnoses:  Pyelonephritis    ED Discharge Orders        Ordered    oxyCODONE (ROXICODONE) 5 MG immediate release tablet  Every 6 hours PRN     06/05/17 1936    cephALEXin (KEFLEX) 500 MG capsule  4 times daily     06/05/17 1936    ondansetron (ZOFRAN ODT) 4 MG disintegrating tablet  Every 8 hours PRN     06/05/17 1938        Liberty Handy, PA-C 06/05/17 1945    Vanetta Mulders, MD 06/06/17 2033

## 2017-06-07 LAB — URINE CULTURE: Culture: >=100000 — AB

## 2017-06-08 ENCOUNTER — Telehealth: Payer: Self-pay | Admitting: *Deleted

## 2017-06-08 NOTE — Telephone Encounter (Signed)
Post ED Visit - Positive Culture Follow-up  Culture report reviewed by antimicrobial stewardship pharmacist:   Enzo Bi, Pharm.D.  Celedonio Miyamoto, Pharm.D., BCPS AQ-ID  Garvin Fila, Pharm.D., BCPS  Georgina Pillion, Pharm.D., BCPS  [place], [address].D., BCPS, AAHIVP  Estella Husk, Pharm.D., BCPS, AAHIVP  Lysle Pearl, PharmD, BCPS  Blake Divine, PharmD  Pollyann Samples, PharmD, BCPS Amy Juel Burrow PharmD Candidate  Positive urine culture Treated with Cephalexin, organism sensitive to the same and no further patient follow-up is required at this time.  Virl Axe [facility] 06/08/2017, 10:08 AM

## 2017-06-10 LAB — CULTURE, BLOOD (ROUTINE X 2)
CULTURE: NO GROWTH
Culture: NO GROWTH
SPECIAL REQUESTS: ADEQUATE
Special Requests: ADEQUATE

## 2017-08-18 ENCOUNTER — Other Ambulatory Visit: Payer: Self-pay

## 2017-08-18 ENCOUNTER — Emergency Department (HOSPITAL_COMMUNITY)
Admission: EM | Admit: 2017-08-18 | Discharge: 2017-08-19 | Disposition: A | Discharge status: Home / Self Care | Payer: Self-pay | Attending: Emergency Medicine | Admitting: Emergency Medicine

## 2017-08-18 ENCOUNTER — Encounter (HOSPITAL_COMMUNITY): Payer: Self-pay | Admitting: Emergency Medicine

## 2017-08-18 DIAGNOSIS — N12 Tubulo-interstitial nephritis, not specified as acute or chronic: Secondary | ICD-10-CM | POA: Insufficient documentation

## 2017-08-18 DIAGNOSIS — F17210 Nicotine dependence, cigarettes, uncomplicated: Secondary | ICD-10-CM | POA: Insufficient documentation

## 2017-08-18 LAB — I-STAT TROPONIN, ED: TROPONIN I, POC: 0 ng/mL (ref 0.00–0.08)

## 2017-08-18 LAB — I-STAT BETA HCG BLOOD, ED (MC, WL, AP ONLY): I-stat hCG, quantitative: <5 m[IU]/mL (ref <5)

## 2017-08-18 MED ORDER — ACETAMINOPHEN 325 MG PO TABS
650.0000 mg | ORAL_TABLET | Freq: Once | ORAL | Status: AC | PRN
  Administered 2017-08-18: 650 mg via ORAL
  Filled 2017-08-18: qty 2

## 2017-08-18 NOTE — ED Notes (Signed)
Pt now complains of centralized chest pain.  Described as pressure

## 2017-08-18 NOTE — ED Triage Notes (Signed)
Pt reports chills and emesis(X5) that started about two hours ago.  Pt also complains of a headache.

## 2017-08-19 ENCOUNTER — Emergency Department (HOSPITAL_COMMUNITY): Payer: Self-pay

## 2017-08-19 LAB — URINALYSIS, ROUTINE W REFLEX MICROSCOPIC
Bacteria, UA: NONE SEEN
GLUCOSE, UA: NEGATIVE mg/dL
HGB URINE DIPSTICK: NEGATIVE
KETONES UR: 20 mg/dL — AB
NITRITE: NEGATIVE
PH: 5 (ref 5.0–8.0)
PROTEIN: NEGATIVE mg/dL
Specific Gravity, Urine: 1.02 (ref 1.005–1.030)

## 2017-08-19 LAB — COMPREHENSIVE METABOLIC PANEL
ALT: 11 U/L (ref 0–44)
AST: 17 U/L (ref 15–41)
Albumin: 3.8 g/dL (ref 3.5–5.0)
Alkaline Phosphatase: 137 U/L — ABNORMAL HIGH (ref 38–126)
Anion gap: 9 (ref 5–15)
BILIRUBIN TOTAL: 0.7 mg/dL (ref 0.3–1.2)
BUN: 5 mg/dL — AB (ref 6–20)
CO2: 21 mmol/L — ABNORMAL LOW (ref 22–32)
CREATININE: 0.82 mg/dL (ref 0.44–1.00)
Calcium: 10.4 mg/dL — ABNORMAL HIGH (ref 8.9–10.3)
Chloride: 106 mmol/L (ref 98–111)
Glucose, Bld: 113 mg/dL — ABNORMAL HIGH (ref 70–99)
POTASSIUM: 3.6 mmol/L (ref 3.5–5.1)
Sodium: 136 mmol/L (ref 135–145)
TOTAL PROTEIN: 7.1 g/dL (ref 6.5–8.1)

## 2017-08-19 LAB — I-STAT CG4 LACTIC ACID, ED: Lactic Acid, Venous: 0.86 mmol/L (ref 0.5–1.9)

## 2017-08-19 LAB — CBC
HCT: 30.8 % — ABNORMAL LOW (ref 36.0–46.0)
HEMOGLOBIN: 8.8 g/dL — AB (ref 12.0–15.0)
MCH: 20.8 pg — ABNORMAL LOW (ref 26.0–34.0)
MCHC: 28.6 g/dL — ABNORMAL LOW (ref 30.0–36.0)
MCV: 72.8 fL — ABNORMAL LOW (ref 78.0–100.0)
Platelets: 216 10*3/uL (ref 150–400)
RBC: 4.23 MIL/uL (ref 3.87–5.11)
RDW: 18.7 % — ABNORMAL HIGH (ref 11.5–15.5)
WBC: 10.6 10*3/uL — AB (ref 4.0–10.5)

## 2017-08-19 LAB — LIPASE, BLOOD: Lipase: 23 U/L (ref 11–51)

## 2017-08-19 MED ORDER — ONDANSETRON HCL 4 MG PO TABS: 4.0000 mg | ORAL_TABLET | Freq: Four times a day (QID) | ORAL | 0 refills | Status: DC

## 2017-08-19 MED ORDER — CEFTRIAXONE SODIUM 2 G IJ SOLR
2.0000 g | Freq: Once | INTRAMUSCULAR | Status: AC
  Administered 2017-08-19: 2 g via INTRAVENOUS
  Filled 2017-08-19: qty 20

## 2017-08-19 MED ORDER — ONDANSETRON HCL 4 MG/2ML IJ SOLN
4.0000 mg | Freq: Once | INTRAMUSCULAR | Status: AC
  Administered 2017-08-19: 4 mg via INTRAVENOUS
  Filled 2017-08-19: qty 2

## 2017-08-19 MED ORDER — CEPHALEXIN 500 MG PO CAPS: 500.0000 mg | ORAL_CAPSULE | Freq: Two times a day (BID) | ORAL | 0 refills | Status: DC

## 2017-08-19 MED ORDER — SODIUM CHLORIDE 0.9 % IV BOLUS
1000.0000 mL | Freq: Once | INTRAVENOUS | Status: AC
  Administered 2017-08-19: 1000 mL via INTRAVENOUS

## 2017-08-19 NOTE — ED Notes (Signed)
Patient transported to X-ray 

## 2017-08-19 NOTE — ED Notes (Signed)
Signature pad unavailable at time of pt discharge. Pt verbalized understanding of discharge instructions and prescriptions. Pt denied any further requests.

## 2017-08-19 NOTE — ED Notes (Signed)
Patient reminded that we need a urine sample.

## 2017-08-19 NOTE — ED Notes (Signed)
Lab confirmed they are running urine culture.

## 2017-08-19 NOTE — ED Provider Notes (Signed)
[facility] EMERGENCY DEPARTMENT Provider Note   CSN: [id number] Arrival date & time: 08/18/17  2258     History   Chief Complaint Chief Complaint  Patient presents with  . Fever  . Emesis  . Chest Pain    HPI Virginia Ferguson is a 37 y.o. female.  Patient comes to the ER for evaluation of chills with nausea and vomiting.  She reports that she acutely became ill around 5 PM today with nausea and vomited one time.  After vomiting she started to feel well.  She was able to eat a [place] sandwich.  Then again around 10 PM she started having chills and shaking all over.  She started to feel weak and then became nauseated and vomited multiple times.  She is not experiencing any significant abdominal pain.  There has not been diarrhea.  She denies sore throat, cough, cold symptoms.  She has had a kidney infection in May, but has not had any urinary frequency, dysuria or flank pain.     Past Medical History:  Diagnosis Date  . Medical history non-contributory     There are no active problems to display for this patient.   Past Surgical History:  Procedure Laterality Date  . NO PAST SURGERIES       OB History    Gravida  1   Para  1   Term  1   Preterm      AB      Living  1     SAB      TAB      Ectopic      Multiple      Live Births               Home Medications    Prior to Admission medications   Medication Sig Start Date End Date Taking? Authorizing Provider  aspirin-acetaminophen-caffeine (EXCEDRIN MIGRAINE) [phone] MG tablet Take 1 tablet by mouth every 6 (six) hours as needed for headache or migraine.   Yes [provider]  cephALEXin (KEFLEX) 500 MG capsule Take 1 capsule (500 mg total) by mouth 2 (two) times daily. 08/19/17   Gilda Crease, MD  ondansetron (ZOFRAN) 4 MG tablet Take 1 tablet (4 mg total) by mouth every 6 (six) hours. 08/19/17   Gilda Crease, MD    Family History Family History    Problem Relation Age of Onset  . Hypertension Mother   . Diabetes Mother     Social History Social History   Tobacco Use  . Smoking status: Current Some Day Smoker    Packs/day: 0.50    Types: Cigarettes  . Smokeless tobacco: Never Used  Substance Use Topics  . Alcohol use: Yes    Comment: occasional  . Drug use: No     Allergies   Patient has no known allergies.   Review of Systems Review of Systems  Constitutional: Positive for chills.  Gastrointestinal: Positive for nausea and vomiting.  Genitourinary: Negative for dysuria and flank pain.  All other systems reviewed and are negative.    Physical Exam Updated Vital Signs BP 108/65   Pulse 65   Temp 100.3 F (37.9 C) (Oral)   Resp 15   Ht 5' 4.5" (1.638 m)   Wt 121.6 kg (268 lb)   LMP 08/06/2017 (Approximate)   SpO2 100%   BMI 45.29 kg/m   Physical Exam  Constitutional: She is oriented to person, place, and time. She appears well-developed  and well-nourished. No distress.  HENT:  Head: Normocephalic and atraumatic.  Right Ear: Hearing normal.  Left Ear: Hearing normal.  Nose: Nose normal.  Mouth/Throat: Oropharynx is clear and moist and mucous membranes are normal.  Eyes: Pupils are equal, round, and reactive to light. Conjunctivae and EOM are normal.  Neck: Normal range of motion. Neck supple.  Cardiovascular: Regular rhythm, S1 normal and S2 normal. Tachycardia present. Exam reveals no gallop and no friction rub.  No murmur heard. Pulmonary/Chest: Effort normal and breath sounds normal. No respiratory distress. She exhibits no tenderness.  Abdominal: Soft. Normal appearance and bowel sounds are normal. There is no hepatosplenomegaly. There is no tenderness. There is no rebound, no guarding, no tenderness at McBurney's point and negative Murphy's sign. No hernia.  Musculoskeletal: Normal range of motion.  Neurological: She is alert and oriented to person, place, and time. She has normal strength. No  cranial nerve deficit or sensory deficit. Coordination normal. GCS eye subscore is 4. GCS verbal subscore is 5. GCS motor subscore is 6.  Skin: Skin is warm, dry and intact. No rash noted. No cyanosis.  Psychiatric: She has a normal mood and affect. Her speech is normal and behavior is normal. Thought content normal.  Nursing note and vitals reviewed.    ED Treatments / Results  Labs (all labs ordered are listed, but only abnormal results are displayed) Labs Reviewed  COMPREHENSIVE METABOLIC PANEL - Abnormal; Notable for the following components:      Result Value   CO2 21 (*)    Glucose, Bld 113 (*)    BUN 5 (*)    Calcium 10.4 (*)    Alkaline Phosphatase 137 (*)    All other components within normal limits  CBC - Abnormal; Notable for the following components:   WBC 10.6 (*)    Hemoglobin 8.8 (*)    HCT 30.8 (*)    MCV 72.8 (*)    MCH 20.8 (*)    MCHC 28.6 (*)    RDW 18.7 (*)    All other components within normal limits  URINALYSIS, ROUTINE W REFLEX MICROSCOPIC - Abnormal; Notable for the following components:   APPearance HAZY (*)    Bilirubin Urine SMALL (*)    Ketones, ur 20 (*)    Leukocytes, UA SMALL (*)    All other components within normal limits  URINE CULTURE  LIPASE, BLOOD  I-STAT BETA HCG BLOOD, ED ([facility], [facility], [facility] ONLY)  I-STAT TROPONIN, ED  I-STAT CG4 LACTIC ACID, ED    EKG EKG Interpretation  Date/Time:  Wednesday August 18 2017 23:08:29 EDT Ventricular Rate:  137 PR Interval:  126 QRS Duration: 72 QT Interval:  272 QTC Calculation: 410 R Axis:   61 Text Interpretation:  Sinus tachycardia Otherwise normal ECG Confirmed by Gilda Creaseollina, Junius Faucett J ([id number]) on 08/18/2017 11:32:28 PM   Radiology Dg Abd Acute W/chest  Result Date: 08/19/2017 CLINICAL DATA:  Abdominal and chest pain. EXAM: DG ABDOMEN ACUTE W/ 1V CHEST COMPARISON:  Abdominal CT 06/05/2017 FINDINGS: The cardiomediastinal contours are normal. The lungs are clear. There is no free intra-abdominal  air. No dilated bowel loops to suggest obstruction. Small volume of stool throughout the colon. No radiopaque calculi. No acute osseous abnormalities are seen. Transitional lumbosacral anatomy with non fusion posterior elements of S1, a normal variant. IMPRESSION: Negative radiographs of the chest and abdomen. Electronically Signed   By: Rubye OaksMelanie  Ehinger M.D.   On: 08/19/2017 03:25    Procedures Procedures (including critical care  time)  Medications Ordered in ED Medications  acetaminophen (TYLENOL) tablet 650 mg (650 mg Oral Given 08/18/17 2317)  sodium chloride 0.9 % bolus 1,000 mL (0 mLs Intravenous Stopped 08/19/17 0423)  ondansetron (ZOFRAN) injection 4 mg (4 mg Intravenous Given 08/19/17 0251)  cefTRIAXone (ROCEPHIN) 2 g in sodium chloride 0.9 % 100 mL IVPB (0 g Intravenous Stopped 08/19/17 0601)     Initial Impression / Assessment and Plan / ED Course  I have reviewed the triage vital signs and the nursing notes.  Pertinent labs & imaging results that were available during my care of the patient were reviewed by me and considered in my medical decision making (see chart for details).     Patient presents to the emergency department for evaluation of chest pain, nausea and vomiting.  Symptoms began several hours before coming to the ER.  She started having some chest pain after vomiting which I believe is secondary to the vomiting.  No cardiac etiology is suspected.  Troponin negative, EKG unremarkable.  Patient had a fever of 103 at arrival.  She was experiencing tachycardia with this as well.  Both fever and tachycardia resolved with antipyretics and IV fluids.  Lactic acid is normal.  No significant leukocytosis.  I do not suspect sepsis in this patient.  Work-up has been largely unremarkable other than evidence of urinary tract infection.  Patient does have a history of similar symptoms with bladder and kidney infection in the past.  She was therefore initiated on IV Rocephin.  She has  continued to do well, no further nausea or vomiting.  She is therefore candidate for outpatient treatment with continued Keflex, return if her symptoms worsen.  Final Clinical Impressions(s) / ED Diagnoses   Final diagnoses:  Pyelonephritis    ED Discharge Orders        Ordered    cephALEXin (KEFLEX) 500 MG capsule  2 times daily     08/19/17 0619    ondansetron (ZOFRAN) 4 MG tablet  Every 6 hours     08/19/17 0619       Gilda Crease, MD 08/19/17 [phone]

## 2017-08-21 LAB — URINE CULTURE

## 2018-09-10 ENCOUNTER — Other Ambulatory Visit: Payer: Self-pay

## 2018-09-10 DIAGNOSIS — Z20822 Contact with and (suspected) exposure to covid-19: Secondary | ICD-10-CM

## 2018-09-11 LAB — NOVEL CORONAVIRUS, NAA: SARS-CoV-2, NAA: DETECTED — AB

## 2018-09-12 ENCOUNTER — Telehealth: Payer: Self-pay

## 2018-09-12 NOTE — Telephone Encounter (Signed)
Derrick with [facility] called back and reports pt. Is not one of their clients. Called pt. And she sees Dr. Ayesha Rumpf. Pt. Given COVID 19 results. Will continue to quarantine 10 from beginning of symptoms. Reviewed home safety points.Pt. will notify her PCP. [facility]. Notified.

## 2018-10-29 ENCOUNTER — Encounter (HOSPITAL_COMMUNITY): Payer: Self-pay | Admitting: Emergency Medicine

## 2018-10-29 ENCOUNTER — Other Ambulatory Visit: Payer: Self-pay

## 2018-10-29 ENCOUNTER — Ambulatory Visit (HOSPITAL_COMMUNITY)
Admission: EM | Admit: 2018-10-29 | Discharge: 2018-10-29 | Disposition: A | Discharge status: Home / Self Care | Payer: Medicaid Other | Attending: Emergency Medicine | Admitting: Emergency Medicine

## 2018-10-29 DIAGNOSIS — R1032 Left lower quadrant pain: Secondary | ICD-10-CM

## 2018-10-29 DIAGNOSIS — L739 Follicular disorder, unspecified: Secondary | ICD-10-CM

## 2018-10-29 LAB — POCT URINALYSIS DIP (DEVICE)
Bilirubin Urine: NEGATIVE
Glucose, UA: NEGATIVE mg/dL
Hgb urine dipstick: NEGATIVE
Ketones, ur: NEGATIVE mg/dL
Nitrite: NEGATIVE
Protein, ur: NEGATIVE mg/dL
Specific Gravity, Urine: 1.025 (ref 1.005–1.030)
Urobilinogen, UA: 0.2 mg/dL (ref 0.0–1.0)
pH: 7 (ref 5.0–8.0)

## 2018-10-29 LAB — POCT PREGNANCY, URINE: Preg Test, Ur: NEGATIVE

## 2018-10-29 MED ORDER — POLYETHYLENE GLYCOL 3350 17 GM/SCOOP PO POWD: 17.0000 g | Freq: Every day | ORAL | 0 refills | Status: DC

## 2018-10-29 NOTE — Discharge Instructions (Signed)
Your urine is not obviously infected, I have sent it to be cultured to confirm this. We will test the vagina as well to ensure no source of infection there also.  Will notify you of any positive findings and if any changes to treatment are needed.   Warm compresses to the folliculitis to left pubic region. Avoid irritating it as able, it should heal in the next week.  Miralax as needed daily to promote regular bowel movements and limit straining. Constipation could certainly be contributing to the pain to the LLQ. Drink plenty of water regularly and eat plenty of fiber.  Please follow up with your primary care provider for recheck in the next two weeks, return or go to the ER for any worsening of abdominal pain, fevers or otherwise worsening.

## 2018-10-29 NOTE — ED Triage Notes (Signed)
Pt sts left sided groin pain; pt unsure if could have boil in area

## 2018-10-29 NOTE — ED Provider Notes (Signed)
[facility]    CSN: [id number] Arrival date & time: 10/29/18  1219      History   Chief Complaint Chief Complaint  Patient presents with  . Groin Pain    HPI Virginia Ferguson is a 38 y.o. female.   Virginia Ferguson presents with complaints of left sided abdominal and flank pain. This has been ongoing for the past few weeks even. Waxes and wanes. Radiates to back. Once with sensation of vaginal pressure which has resolved. Maybe some urinary frequency but states drinks a lot of water. States is anemic so eats ice, doesn't take iron supplementation. No vaginal bleeding discharge or itching. LMP 8/31. States had similar in the past when she had a UTI that had progressed. No pain with urination and no blood with urine. Has had normal bowel movements but has noted some blood with wiping a few times, does have to strain at times. Has tried tylenol and ibuprofen which have not necessarily helped. No fevers or chills. No back injury. Also with a bump to left pubic region which is pain, no drainage.   ROS per HPI, negative if not otherwise mentioned.      Past Medical History:  Diagnosis Date  . Medical history non-contributory     There are no active problems to display for this patient.   Past Surgical History:  Procedure Laterality Date  . NO PAST SURGERIES      OB History    Gravida  1   Para  1   Term  1   Preterm      AB      Living  1     SAB      TAB      Ectopic      Multiple      Live Births               Home Medications    Prior to Admission medications   Medication Sig Start Date End Date Taking? Authorizing Provider  aspirin-acetaminophen-caffeine (EXCEDRIN MIGRAINE) [phone] MG tablet Take 1 tablet by mouth every 6 (six) hours as needed for headache or migraine.    [provider]  cephALEXin (KEFLEX) 500 MG capsule Take 1 capsule (500 mg total) by mouth 2 (two) times daily. Patient not taking: Reported on 10/29/2018  08/19/17   Gilda CreasePollina, Christopher J, MD  ondansetron (ZOFRAN) 4 MG tablet Take 1 tablet (4 mg total) by mouth every 6 (six) hours. 08/19/17   Gilda CreasePollina, Christopher J, MD  polyethylene glycol powder (GLYCOLAX/MIRALAX) 17 GM/SCOOP powder Take 17 g by mouth daily. 10/29/18   Georgetta HaberBurky, Natalie B, NP    Family History Family History  Problem Relation Age of Onset  . Hypertension Mother   . Diabetes Mother     Social History Social History   Tobacco Use  . Smoking status: Current Some Day Smoker    Packs/day: 0.50    Types: Cigarettes  . Smokeless tobacco: Never Used  Substance Use Topics  . Alcohol use: Yes    Comment: occasional  . Drug use: No     Allergies   Patient has no known allergies.   Review of Systems Review of Systems   Physical Exam Triage Vital Signs ED Triage Vitals  Enc Vitals Group     BP 10/29/18 1237 138/76     Pulse Rate 10/29/18 1237 70     Resp 10/29/18 1237 18     Temp 10/29/18 1237 98.5 F (36.9  C)     Temp Source 10/29/18 1237 Oral     SpO2 10/29/18 1237 100 %     Weight --      Height --      Head Circumference --      Peak Flow --      Pain Score 10/29/18 1238 9     Pain Loc --      Pain Edu? --      Excl. in GC? --    No data found.  Updated Vital Signs BP 138/76 (BP Location: Left Arm)   Pulse 70   Temp 98.5 F (36.9 C) (Oral)   Resp 18   SpO2 100%   Visual Acuity Right Eye Distance:   Left Eye Distance:   Bilateral Distance:    Right Eye Near:   Left Eye Near:    Bilateral Near:     Physical Exam Constitutional:      General: She is not in acute distress.    Appearance: She is well-developed.  Cardiovascular:     Rate and Rhythm: Normal rate.  Pulmonary:     Effort: Pulmonary effort is normal.  Abdominal:     Tenderness: There is left CVA tenderness.       Comments: Left mid/ lower abdominal discomfort, mild with palpation which radiates around to left mid back with mild cva tenderness as well  Genitourinary:     Comments: Left pubic with folliculitis, appears healing, mildly tender, only approximately 5 mm in diameter, patient does shave pubic hair; no drainage or fluctuance, soft  Skin:    General: Skin is warm and dry.  Neurological:     Mental Status: She is alert and oriented to person, place, and time.      UC Treatments / Results  Labs (all labs ordered are listed, but only abnormal results are displayed) Labs Reviewed  POCT URINALYSIS DIP (DEVICE) - Abnormal; Notable for the following components:      Result Value   Leukocytes,Ua TRACE (*)    All other components within normal limits  URINE CULTURE  POC URINE PREG, ED  POCT PREGNANCY, URINE  CERVICOVAGINAL ANCILLARY ONLY    EKG   Radiology No results found.  Procedures Procedures (including critical care time)  Medications Ordered in UC Medications - No data to display  Initial Impression / Assessment and Plan / UC Course  I have reviewed the triage vital signs and the nursing notes.  Pertinent labs & imaging results that were available during my care of the patient were reviewed by me and considered in my medical decision making (see chart for details).     Left low abdomen and flank pain pain. No hgb to urine. Trace leuks but no nitrite. No obvious UTI at this time. Exam is non specific. Not consistent with an acute surgical abdomen at this time. Afebrile. Non toxic. Constipation discussed? Colitis vs ovarian cyst vs constipation vs pyelonephritis vs nephrolithiasis considered. Will start with constipation treatment. Supportive cares for folliculitis. Strict return and follow up precautions provided. Patient verbalized understanding and agreeable to plan. Ambulatory out of clinic without difficulty.    Final Clinical Impressions(s) / UC Diagnoses   Final diagnoses:  Abdominal pain, LLQ  Folliculitis     Discharge Instructions     Your urine is not obviously infected, I have sent it to be cultured to confirm  this. We will test the vagina as well to ensure no source of infection there also.  Will notify  you of any positive findings and if any changes to treatment are needed.   Warm compresses to the folliculitis to left pubic region. Avoid irritating it as able, it should heal in the next week.  Miralax as needed daily to promote regular bowel movements and limit straining. Constipation could certainly be contributing to the pain to the LLQ. Drink plenty of water regularly and eat plenty of fiber.  Please follow up with your primary care provider for recheck in the next two weeks, return or go to the ER for any worsening of abdominal pain, fevers or otherwise worsening.     ED Prescriptions    Medication Sig Dispense Auth. Provider   polyethylene glycol powder (GLYCOLAX/MIRALAX) 17 GM/SCOOP powder Take 17 g by mouth daily. 255 g Zigmund Gottron, NP     PDMP not reviewed this encounter.   Zigmund Gottron, NP 10/29/18 1407

## 2018-10-30 ENCOUNTER — Other Ambulatory Visit: Payer: Self-pay | Admitting: Emergency Medicine

## 2018-10-31 ENCOUNTER — Telehealth (HOSPITAL_COMMUNITY): Payer: Self-pay | Admitting: Emergency Medicine

## 2018-10-31 LAB — URINE CULTURE: Culture: 60000 — AB

## 2018-10-31 LAB — CERVICOVAGINAL ANCILLARY ONLY
Bacterial Vaginitis (gardnerella): POSITIVE — AB
Candida Glabrata: NEGATIVE
Candida Vaginitis: POSITIVE — AB
Molecular Disclaimer: NEGATIVE
Molecular Disclaimer: NEGATIVE
Molecular Disclaimer: NEGATIVE
Molecular Disclaimer: NORMAL
Trichomonas: POSITIVE — AB

## 2018-10-31 MED ORDER — CEPHALEXIN 500 MG PO CAPS: 500.0000 mg | ORAL_CAPSULE | Freq: Two times a day (BID) | ORAL | 0 refills | Status: AC

## 2018-10-31 MED ORDER — METRONIDAZOLE 500 MG PO TABS: 500.0000 mg | ORAL_TABLET | Freq: Two times a day (BID) | ORAL | 0 refills | Status: AC

## 2018-10-31 MED ORDER — FLUCONAZOLE 150 MG PO TABS: 150.0000 mg | ORAL_TABLET | Freq: Once | ORAL | 0 refills | Status: AC

## 2018-10-31 NOTE — Telephone Encounter (Signed)
Bacterial vaginosis is positive. This was not treated at the urgent care visit.  Flagyl 500 mg BID x 7 days #14 no refills sent to patients pharmacy of choice.    Test for candida (yeast) was positive.  Prescription for fluconazole 150mg  po now, repeat dose in 3d if needed, #2 no refills, sent to the pharmacy of record.  Recheck or followup with PCP for further evaluation if symptoms are not improving.    Trichomonas is positive. Rx  for Flagyl was sent to the pharmacy of record. Pt needs education to refrain from sexual intercourse for 7 days to give the medicine time to work. Sexual partners need to be notified and tested/treated. Condoms may reduce risk of reinfection. Recheck for further evaluation if symptoms are not improving.   Urine culture not treated, per Dr. Lanny Cramp, send keflex BID 500mg  x5 days.   Patient contacted and made aware of    results, all questions answered

## 2018-11-01 LAB — CERVICOVAGINAL ANCILLARY ONLY
Chlamydia: NEGATIVE
Neisseria Gonorrhea: NEGATIVE

## 2019-04-20 ENCOUNTER — Encounter (HOSPITAL_COMMUNITY): Payer: Self-pay

## 2019-04-20 ENCOUNTER — Ambulatory Visit (HOSPITAL_COMMUNITY)
Admission: EM | Admit: 2019-04-20 | Discharge: 2019-04-20 | Disposition: A | Discharge status: Home / Self Care | Payer: Self-pay | Attending: Internal Medicine | Admitting: Internal Medicine

## 2019-04-20 ENCOUNTER — Other Ambulatory Visit: Payer: Self-pay

## 2019-04-20 DIAGNOSIS — F17210 Nicotine dependence, cigarettes, uncomplicated: Secondary | ICD-10-CM | POA: Insufficient documentation

## 2019-04-20 DIAGNOSIS — J029 Acute pharyngitis, unspecified: Secondary | ICD-10-CM | POA: Insufficient documentation

## 2019-04-20 DIAGNOSIS — R0982 Postnasal drip: Secondary | ICD-10-CM | POA: Insufficient documentation

## 2019-04-20 DIAGNOSIS — R059 Cough, unspecified: Secondary | ICD-10-CM

## 2019-04-20 DIAGNOSIS — Z8249 Family history of ischemic heart disease and other diseases of the circulatory system: Secondary | ICD-10-CM | POA: Insufficient documentation

## 2019-04-20 DIAGNOSIS — Z833 Family history of diabetes mellitus: Secondary | ICD-10-CM | POA: Insufficient documentation

## 2019-04-20 DIAGNOSIS — Z20822 Contact with and (suspected) exposure to covid-19: Secondary | ICD-10-CM | POA: Insufficient documentation

## 2019-04-20 DIAGNOSIS — R05 Cough: Secondary | ICD-10-CM | POA: Insufficient documentation

## 2019-04-20 LAB — POCT RAPID STREP A: Streptococcus, Group A Screen (Direct): NEGATIVE

## 2019-04-20 NOTE — Discharge Instructions (Addendum)
Your COVID test is pending.  You should self quarantine until your test result is back and is negative.    Take Tylenol as needed for fever or discomfort.  Rest and keep yourself hydrated.    Go to the emergency department if you develop high fever, shortness of breath, severe diarrhea, or other concerning symptoms.  .   May use claritin or zyrtec as needed.

## 2019-04-20 NOTE — ED Triage Notes (Signed)
Pt states she has a sore throat x 4 days.

## 2019-04-20 NOTE — ED Provider Notes (Signed)
[facility]    CSN: [id number] Arrival date & time: 04/20/19  0814      History   Chief Complaint Chief Complaint  Patient presents with  . Sore Throat    HPI Virginia Ferguson is a 39 y.o. female.   Patient reports that she has had a cough, sore throat, postnasal drip for the last 4 days.  Patient is made no attempt to treat this at home.  No significant medical history.  Denies history of seasonal allergies.  Denies headache, body aches, chills, fever, nausea, vomiting, diarrhea, rash, other symptoms.  ROS Per HPI  The history is provided by the patient.  Sore Throat    Past Medical History:  Diagnosis Date  . Medical history non-contributory     There are no problems to display for this patient.   Past Surgical History:  Procedure Laterality Date  . NO PAST SURGERIES      OB History    Gravida  1   Para  1   Term  1   Preterm      AB      Living  1     SAB      TAB      Ectopic      Multiple      Live Births               Home Medications    Prior to Admission medications   Medication Sig Start Date End Date Taking? Authorizing Provider  aspirin-acetaminophen-caffeine (EXCEDRIN MIGRAINE) [phone] MG tablet Take 1 tablet by mouth every 6 (six) hours as needed for headache or migraine.    [provider]  ondansetron (ZOFRAN) 4 MG tablet Take 1 tablet (4 mg total) by mouth every 6 (six) hours. Patient not taking: Reported on 04/20/2019 08/19/17   Gilda Crease, MD  polyethylene glycol powder (GLYCOLAX/MIRALAX) 17 GM/SCOOP powder Take 17 g by mouth daily. Patient not taking: Reported on 04/20/2019 10/29/18   Georgetta Haber, NP    Family History Family History  Problem Relation Age of Onset  . Hypertension Mother   . Diabetes Mother     Social History Social History   Tobacco Use  . Smoking status: Current Some Day Smoker    Packs/day: 0.50    Types: Cigarettes  . Smokeless tobacco: Never Used    Substance Use Topics  . Alcohol use: Yes    Comment: occasional  . Drug use: No     Allergies   Patient has no known allergies.   Review of Systems Review of Systems   Physical Exam Triage Vital Signs ED Triage Vitals  Enc Vitals Group     BP 04/20/19 1017 120/77     Pulse Rate 04/20/19 1017 86     Resp 04/20/19 1017 16     Temp 04/20/19 1017 98.6 F (37 C)     Temp Source 04/20/19 1017 Oral     SpO2 04/20/19 1017 100 %     Weight 04/20/19 1015 (!) 310 lb (140.6 kg)     Height --      Head Circumference --      Peak Flow --      Pain Score 04/20/19 1015 6     Pain Loc --      Pain Edu? --      Excl. in GC? --    No data found.  Updated Vital Signs BP 120/77 (BP Location: Right Arm)  Pulse 86   Temp 98.6 F (37 C) (Oral)   Resp 16   Wt (!) 310 lb (140.6 kg)   LMP 03/16/2019   SpO2 100%   BMI 52.39 kg/m   Visual Acuity Right Eye Distance:   Left Eye Distance:   Bilateral Distance:    Right Eye Near:   Left Eye Near:    Bilateral Near:     Physical Exam Vitals and nursing note reviewed.  Constitutional:      General: She is not in acute distress.    Appearance: She is well-developed. She is obese.  HENT:     Head: Normocephalic and atraumatic.     Right Ear: Tympanic membrane normal.     Left Ear: A middle ear effusion is present.     Mouth/Throat:     Mouth: Mucous membranes are moist.     Pharynx: Oropharynx is clear. Uvula midline. No pharyngeal swelling or posterior oropharyngeal erythema.  Eyes:     Conjunctiva/sclera: Conjunctivae normal.  Cardiovascular:     Rate and Rhythm: Normal rate and regular rhythm.     Heart sounds: Normal heart sounds. No murmur.  Pulmonary:     Effort: Pulmonary effort is normal. No respiratory distress.     Breath sounds: Normal breath sounds. No stridor. No wheezing, rhonchi or rales.  Chest:     Chest wall: No tenderness.  Abdominal:     General: Bowel sounds are normal.     Palpations: Abdomen is  soft.     Tenderness: There is no abdominal tenderness.  Musculoskeletal:     Cervical back: Neck supple.  Lymphadenopathy:     Cervical: Cervical adenopathy present.  Skin:    General: Skin is warm and dry.     Capillary Refill: Capillary refill takes less than 2 seconds.  Neurological:     General: No focal deficit present.     Mental Status: She is alert and oriented to person, place, and time.  Psychiatric:        Mood and Affect: Mood normal.        Behavior: Behavior normal.      UC Treatments / Results  Labs (all labs ordered are listed, but only abnormal results are displayed) Labs Reviewed  SARS CORONAVIRUS 2 (TAT 6-24 HRS)  CULTURE, GROUP A STREP [facility])  POCT RAPID STREP A    EKG   Radiology No results found.  Procedures Procedures (including critical care time)  Medications Ordered in UC Medications - No data to display  Initial Impression / Assessment and Plan / UC Course  I have reviewed the triage vital signs and the nursing notes.  Pertinent labs & imaging results that were available during my care of the patient were reviewed by me and considered in my medical decision making (see chart for details).  Clinical Course as of Apr 20 1118  Thu Apr 20, 2019  1104 POCT Rapid Strep A [SM]    Clinical Course User Index [SM] Faustino Congress, NP    Viral pharyngitis, cough, postnasal drip x4 days.  Use over-the-counter ibuprofen or Tylenol for throat pain or headaches, fever.  May also use over-the-counter Mucinex, and cough syrup as needed.  Instructed to also start Zyrtec or Claritin.  Rapid strep negative in office today.  Will culture, and inform patient of results.  Will treat accordingly.  Covid swab obtained in office today.  Will inform patient when results are back.  Instructed to quarantine until results are back and  negative.  Work note given.  Instructed to go to the ER for shortness of breath, high fever, severe diarrhea, other concerning  symptoms. Final Clinical Impressions(s) / UC Diagnoses   Final diagnoses:  Viral pharyngitis  Cough  Post-nasal drip     Discharge Instructions     Your COVID test is pending.  You should self quarantine until your test result is back and is negative.    Take Tylenol as needed for fever or discomfort.  Rest and keep yourself hydrated.    Go to the emergency department if you develop high fever, shortness of breath, severe diarrhea, or other concerning symptoms.  .   May use claritin or zyrtec as needed.     ED Prescriptions    None     PDMP not reviewed this encounter.   Moshe Cipro, NP 04/20/19 1120

## 2019-04-21 LAB — CULTURE, GROUP A STREP (THRC)

## 2019-04-21 LAB — SARS CORONAVIRUS 2 (TAT 6-24 HRS): SARS Coronavirus 2: NEGATIVE

## 2019-06-29 ENCOUNTER — Other Ambulatory Visit: Payer: Self-pay

## 2019-06-29 ENCOUNTER — Ambulatory Visit (HOSPITAL_COMMUNITY)
Admission: EM | Admit: 2019-06-29 | Discharge: 2019-06-29 | Disposition: A | Discharge status: Home / Self Care | Payer: Self-pay | Attending: Emergency Medicine | Admitting: Emergency Medicine

## 2019-06-29 ENCOUNTER — Encounter (HOSPITAL_COMMUNITY): Payer: Self-pay

## 2019-06-29 DIAGNOSIS — Z113 Encounter for screening for infections with a predominantly sexual mode of transmission: Secondary | ICD-10-CM | POA: Insufficient documentation

## 2019-06-29 DIAGNOSIS — N390 Urinary tract infection, site not specified: Secondary | ICD-10-CM | POA: Insufficient documentation

## 2019-06-29 DIAGNOSIS — Z3202 Encounter for pregnancy test, result negative: Secondary | ICD-10-CM

## 2019-06-29 DIAGNOSIS — R109 Unspecified abdominal pain: Secondary | ICD-10-CM

## 2019-06-29 DIAGNOSIS — R319 Hematuria, unspecified: Secondary | ICD-10-CM | POA: Insufficient documentation

## 2019-06-29 LAB — POCT URINALYSIS DIP (DEVICE)
Bilirubin Urine: NEGATIVE
Glucose, UA: NEGATIVE mg/dL
Ketones, ur: NEGATIVE mg/dL
Nitrite: NEGATIVE
Protein, ur: 30 mg/dL — AB
Specific Gravity, Urine: 1.02 (ref 1.005–1.030)
Urobilinogen, UA: 0.2 mg/dL (ref 0.0–1.0)
pH: 8.5 — ABNORMAL HIGH (ref 5.0–8.0)

## 2019-06-29 LAB — HIV ANTIBODY (ROUTINE TESTING W REFLEX): HIV Screen 4th Generation wRfx: NONREACTIVE

## 2019-06-29 LAB — POC URINE PREG, ED: Preg Test, Ur: NEGATIVE

## 2019-06-29 MED ORDER — CEPHALEXIN 500 MG PO CAPS: 500.0000 mg | ORAL_CAPSULE | Freq: Four times a day (QID) | ORAL | 0 refills | Status: AC

## 2019-06-29 MED ORDER — IBUPROFEN 600 MG PO TABS: 600.0000 mg | ORAL_TABLET | Freq: Four times a day (QID) | ORAL | 0 refills | Status: DC | PRN

## 2019-06-29 NOTE — ED Triage Notes (Signed)
Pt is here with left flank pain that has moved to her abdominal and now to her left side back area this started 3 days ago. Pt has Excedrin taken to relieve discomfort.

## 2019-06-29 NOTE — ED Provider Notes (Signed)
HPI  SUBJECTIVE: Virginia Ferguson is a 39 y.o. female who presents with 3 to 4 days of left flank pain that radiates to her back. It is intermittent, lasting hours. She describes it as dull, sharp, burning and achy. She reports odorous, dark yellow urine. She reports low midline abdominal pressure with urinating. States that sometimes she feels as if she cannot completely empty her bladder. No dysuria, urgency, frequency, cloudy urine, hematuria. No nausea, vomiting, fevers, body aches. No other abdominal pain. No vaginal bleeding, odor, discharge. She is in a long-term monogamous sexual relationship with a female who is asymptomatic. However he is concerned about STDs and would like to be tested. She has had symptoms like this before and was found to have pyelonephritis. She had a normal bowel movement today without any change in her symptoms. No antipyretic in the past 4 to 6 hours. She has tried Excedrin and warm and cold compresses. Excedrin helps. Symptoms are worse with lying on her left side. She has a past medical history of UTI, pyelonephritis, HSV, trichomonas and yeast. No history of nephrolithiasis diabetes hypertension, gonorrhea, chlamydia, HIV, syphilis, BV. LMP: 4/28. BOF:BPZWC, Jocelyn Lamer, MD  Past Medical History:  Diagnosis Date  . Medical history non-contributory     Past Surgical History:  Procedure Laterality Date  . NO PAST SURGERIES      Family History  Problem Relation Age of Onset  . Hypertension Mother   . Diabetes Mother     Social History   Tobacco Use  . Smoking status: Current Some Day Smoker    Packs/day: 0.50    Types: Cigarettes  . Smokeless tobacco: Never Used  Substance Use Topics  . Alcohol use: Yes    Comment: occasional  . Drug use: No    No current facility-administered medications for this encounter.  Current Outpatient Medications:  .  cephALEXin (KEFLEX) 500 MG capsule, Take 1 capsule (500 mg total) by mouth 4 (four) times daily for 10 days.,  Disp: 40 capsule, Rfl: 0 .  ibuprofen (ADVIL) 600 MG tablet, Take 1 tablet (600 mg total) by mouth every 6 (six) hours as needed., Disp: 30 tablet, Rfl: 0  No Known Allergies   ROS  As noted in HPI.   Physical Exam  BP (!) 120/52 (BP Location: Right Arm)   Pulse 85   Temp 98.7 F (37.1 C) (Oral)   Resp 20   LMP 06/07/2019   SpO2 100%   Constitutional: Well developed, well nourished, no acute distress Eyes:  EOMI, conjunctiva normal bilaterally HENT: Normocephalic, atraumatic,mucus membranes moist Respiratory: Normal inspiratory effort Cardiovascular: Normal rate GI: nondistended soft. Positive suprapubic, left flank tenderness. No other tenderness. Back: Mild left CVAT. skin: No rash, skin intact Musculoskeletal: no deformities Neurologic: Alert & oriented x 3, no focal neuro deficits Psychiatric: Speech and behavior appropriate   ED Course   Medications - No data to display  Orders Placed This Encounter  Procedures  . Urine culture    Standing Status:   Standing    Number of Occurrences:   1    Order Specific Question:   List patient's active antibiotics    Answer:   keflex or macrobid  . RPR    Standing Status:   Standing    Number of Occurrences:   1  . HIV antibody    Standing Status:   Standing    Number of Occurrences:   1  . POC urine pregnancy    Standing Status:  Standing    Number of Occurrences:   1  . POCT urinalysis dip (device)    Standing Status:   Standing    Number of Occurrences:   1    Results for orders placed or performed during the hospital encounter of 06/29/19 (from the past 24 hour(s))  POCT urinalysis dip (device)     Status: Abnormal   Collection Time: 06/29/19  1:38 PM  Result Value Ref Range   Glucose, UA NEGATIVE NEGATIVE mg/dL   Bilirubin Urine NEGATIVE NEGATIVE   Ketones, ur NEGATIVE NEGATIVE mg/dL   Specific Gravity, Urine 1.020 1.005 - 1.030   Hgb urine dipstick SMALL (A) NEGATIVE   pH 8.5 (H) 5.0 - 8.0   Protein,  ur 30 (A) NEGATIVE mg/dL   Urobilinogen, UA 0.2 0.0 - 1.0 mg/dL   Nitrite NEGATIVE NEGATIVE   Leukocytes,Ua TRACE (A) NEGATIVE  POC urine pregnancy     Status: None   Collection Time: 06/29/19  1:46 PM  Result Value Ref Range   Preg Test, Ur NEGATIVE NEGATIVE   No results found.  ED Clinical Impression  1. Urinary tract infection with hematuria, site unspecified   2. Screening for STD (sexually transmitted disease)      ED Assessment/Plan  1. UTI. H&P consistent with an ascending UTI. Will treat as a complicated UTI with Keflex 4 times daily for 10 days. She has mild left CVAT however no systemic symptoms, her vitals are normal and she has not taken any antipyretic in 6 hours. Doubt diverticulitis. Her abdomen is benign. Doubt obstructing nephrolithiasis as the pain is starting in her lower abdomen and extending towards her back. Sending urine off for culture to confirm antibiotic choice and diagnosis. Do not think that she needs Pyridium. We will have her push fluids. Follow-up with her PMD in several days if not getting any better. To the ER if she gets worse.  2. STD screening. Gonorrhea, chlamydia, HIV, RPR, trichomonas, BV, yeast sent. Will wait for labs prior to initiating treatment.  Work note for today.  Discussed labs,  MDM, treatment plan, and plan for follow-up with patient. Discussed sn/sx that should prompt return to the ED. patient agrees with plan.   Meds ordered this encounter  Medications  . cephALEXin (KEFLEX) 500 MG capsule    Sig: Take 1 capsule (500 mg total) by mouth 4 (four) times daily for 10 days.    Dispense:  40 capsule    Refill:  0  . ibuprofen (ADVIL) 600 MG tablet    Sig: Take 1 tablet (600 mg total) by mouth every 6 (six) hours as needed.    Dispense:  30 tablet    Refill:  0    *This clinic note was created using [employer]. Therefore, there may be occasional mistakes despite careful proofreading.   ?    Melynda Ripple,  MD 06/29/19 1426

## 2019-06-29 NOTE — Discharge Instructions (Addendum)
Continue pushing extra fluids. Water with lemon is fine. Finish the Keflex unless the medical provider tells you to stop. You may take 600 mg of ibuprofen combined with 1000 mg of Tylenol 3-4 times a day as needed for pain. We will contact you if any of your other labs come back abnormal and require treatment. Refrain from intercourse until you know what your labs are, your symptoms have resolved and you finish all of your medication.

## 2019-06-30 ENCOUNTER — Telehealth (HOSPITAL_COMMUNITY): Payer: Self-pay | Admitting: Orthopedic Surgery

## 2019-06-30 LAB — CERVICOVAGINAL ANCILLARY ONLY
Bacterial Vaginitis (gardnerella): POSITIVE — AB
Candida Glabrata: NEGATIVE
Candida Vaginitis: NEGATIVE
Chlamydia: NEGATIVE
Comment: NEGATIVE
Comment: NEGATIVE
Comment: NEGATIVE
Comment: NEGATIVE
Comment: NEGATIVE
Comment: NORMAL
Neisseria Gonorrhea: NEGATIVE
Trichomonas: POSITIVE — AB

## 2019-06-30 LAB — RPR: RPR Ser Ql: NONREACTIVE

## 2019-06-30 MED ORDER — METRONIDAZOLE 500 MG PO TABS
500.0000 mg | ORAL_TABLET | Freq: Two times a day (BID) | ORAL | 0 refills | Status: DC
Start: 2019-06-30 — End: 2019-12-23

## 2019-07-01 LAB — URINE CULTURE: Culture: >=100000 — AB

## 2019-12-18 ENCOUNTER — Other Ambulatory Visit: Payer: Self-pay

## 2019-12-18 ENCOUNTER — Ambulatory Visit (HOSPITAL_COMMUNITY)
Admission: EM | Admit: 2019-12-18 | Discharge: 2019-12-18 | Discharge status: Against Medical Advice | Payer: Medicaid Other

## 2019-12-18 NOTE — ED Notes (Signed)
Registration states patient decided to leave

## 2019-12-22 ENCOUNTER — Emergency Department (HOSPITAL_COMMUNITY): Payer: Self-pay

## 2019-12-22 ENCOUNTER — Inpatient Hospital Stay (HOSPITAL_COMMUNITY)
Admission: EM | Admit: 2019-12-22 | Discharge: 2019-12-25 | DRG: 661 | Disposition: A | Discharge status: Home / Self Care | Payer: Self-pay | Attending: Urology | Admitting: Urology

## 2019-12-22 ENCOUNTER — Encounter (HOSPITAL_COMMUNITY): Payer: Self-pay | Admitting: Emergency Medicine

## 2019-12-22 ENCOUNTER — Encounter (HOSPITAL_COMMUNITY): Payer: Self-pay

## 2019-12-22 ENCOUNTER — Ambulatory Visit (HOSPITAL_COMMUNITY)
Admission: EM | Admit: 2019-12-22 | Discharge: 2019-12-22 | Disposition: A | Discharge status: Home / Self Care | Payer: Self-pay | Attending: Emergency Medicine | Admitting: Emergency Medicine

## 2019-12-22 ENCOUNTER — Other Ambulatory Visit: Payer: Self-pay

## 2019-12-22 DIAGNOSIS — Z79899 Other long term (current) drug therapy: Secondary | ICD-10-CM

## 2019-12-22 DIAGNOSIS — F17210 Nicotine dependence, cigarettes, uncomplicated: Secondary | ICD-10-CM | POA: Diagnosis present

## 2019-12-22 DIAGNOSIS — N136 Pyonephrosis: Principal | ICD-10-CM | POA: Diagnosis present

## 2019-12-22 DIAGNOSIS — N201 Calculus of ureter: Secondary | ICD-10-CM | POA: Diagnosis present

## 2019-12-22 DIAGNOSIS — R109 Unspecified abdominal pain: Secondary | ICD-10-CM

## 2019-12-22 DIAGNOSIS — N12 Tubulo-interstitial nephritis, not specified as acute or chronic: Secondary | ICD-10-CM

## 2019-12-22 DIAGNOSIS — Z20822 Contact with and (suspected) exposure to covid-19: Secondary | ICD-10-CM | POA: Diagnosis present

## 2019-12-22 DIAGNOSIS — R10A2 Flank pain, left side: Secondary | ICD-10-CM

## 2019-12-22 DIAGNOSIS — Z833 Family history of diabetes mellitus: Secondary | ICD-10-CM

## 2019-12-22 DIAGNOSIS — N23 Unspecified renal colic: Secondary | ICD-10-CM

## 2019-12-22 DIAGNOSIS — Z8249 Family history of ischemic heart disease and other diseases of the circulatory system: Secondary | ICD-10-CM

## 2019-12-22 LAB — POCT URINALYSIS DIPSTICK, ED / UC
Bilirubin Urine: NEGATIVE
Glucose, UA: NEGATIVE mg/dL
Ketones, ur: NEGATIVE mg/dL
Nitrite: NEGATIVE
Protein, ur: NEGATIVE mg/dL
Specific Gravity, Urine: 1.02 (ref 1.005–1.030)
Urobilinogen, UA: 0.2 mg/dL (ref 0.0–1.0)
pH: 8.5 — ABNORMAL HIGH (ref 5.0–8.0)

## 2019-12-22 LAB — URINALYSIS, ROUTINE W REFLEX MICROSCOPIC
Bilirubin Urine: NEGATIVE
Glucose, UA: NEGATIVE mg/dL
Hgb urine dipstick: NEGATIVE
Ketones, ur: NEGATIVE mg/dL
Nitrite: NEGATIVE
Protein, ur: NEGATIVE mg/dL
Specific Gravity, Urine: 1.01 (ref 1.005–1.030)
pH: 7 (ref 5.0–8.0)

## 2019-12-22 LAB — COMPREHENSIVE METABOLIC PANEL
ALT: 12 U/L (ref 0–44)
AST: 14 U/L — ABNORMAL LOW (ref 15–41)
Albumin: 3.4 g/dL — ABNORMAL LOW (ref 3.5–5.0)
Alkaline Phosphatase: 113 U/L (ref 38–126)
Anion gap: 11 (ref 5–15)
BUN: <5 mg/dL — ABNORMAL LOW (ref 6–20)
CO2: 22 mmol/L (ref 22–32)
Calcium: 11 mg/dL — ABNORMAL HIGH (ref 8.9–10.3)
Chloride: 106 mmol/L (ref 98–111)
Creatinine, Ser: 0.65 mg/dL (ref 0.44–1.00)
GFR, Estimated: >60 mL/min (ref >60)
Glucose, Bld: 95 mg/dL (ref 70–99)
Potassium: 4.2 mmol/L (ref 3.5–5.1)
Sodium: 139 mmol/L (ref 135–145)
Total Bilirubin: 0.6 mg/dL (ref 0.3–1.2)
Total Protein: 7.1 g/dL (ref 6.5–8.1)

## 2019-12-22 LAB — CBC WITH DIFFERENTIAL/PLATELET
Abs Immature Granulocytes: 0.01 10*3/uL (ref 0.00–0.07)
Basophils Absolute: 0 10*3/uL (ref 0.0–0.1)
Basophils Relative: 1 %
Eosinophils Absolute: 0.3 10*3/uL (ref 0.0–0.5)
Eosinophils Relative: 4 %
HCT: 33.2 % — ABNORMAL LOW (ref 36.0–46.0)
Hemoglobin: 9.8 g/dL — ABNORMAL LOW (ref 12.0–15.0)
Immature Granulocytes: 0 %
Lymphocytes Relative: 28 %
Lymphs Abs: 2 10*3/uL (ref 0.7–4.0)
MCH: 23.4 pg — ABNORMAL LOW (ref 26.0–34.0)
MCHC: 29.5 g/dL — ABNORMAL LOW (ref 30.0–36.0)
MCV: 79.2 fL — ABNORMAL LOW (ref 80.0–100.0)
Monocytes Absolute: 0.8 10*3/uL (ref 0.1–1.0)
Monocytes Relative: 11 %
Neutro Abs: 4 10*3/uL (ref 1.7–7.7)
Neutrophils Relative %: 56 %
Platelets: 255 10*3/uL (ref 150–400)
RBC: 4.19 MIL/uL (ref 3.87–5.11)
RDW: 18.3 % — ABNORMAL HIGH (ref 11.5–15.5)
WBC: 7.1 10*3/uL (ref 4.0–10.5)
nRBC: 0 % (ref 0.0–0.2)

## 2019-12-22 LAB — D-DIMER, QUANTITATIVE: D-Dimer, Quant: 1.06 ug/mL-FEU — ABNORMAL HIGH (ref 0.00–0.50)

## 2019-12-22 LAB — I-STAT BETA HCG BLOOD, ED (MC, WL, AP ONLY): I-stat hCG, quantitative: <5 m[IU]/mL (ref <5)

## 2019-12-22 LAB — TROPONIN I (HIGH SENSITIVITY): Troponin I (High Sensitivity): 3 ng/L (ref <18)

## 2019-12-22 MED ORDER — SODIUM CHLORIDE 0.9 % IV BOLUS
1000.0000 mL | Freq: Once | INTRAVENOUS | Status: AC
  Administered 2019-12-22: 1000 mL via INTRAVENOUS

## 2019-12-22 MED ORDER — SODIUM CHLORIDE 0.9 % IV SOLN
1.0000 g | Freq: Once | INTRAVENOUS | Status: AC
  Administered 2019-12-22: 1 g via INTRAVENOUS
  Filled 2019-12-22: qty 10

## 2019-12-22 MED ORDER — KETOROLAC TROMETHAMINE 30 MG/ML IJ SOLN
INTRAMUSCULAR | Status: AC
  Filled 2019-12-22: qty 1

## 2019-12-22 MED ORDER — ONDANSETRON 4 MG PO TBDP
4.0000 mg | ORAL_TABLET | Freq: Once | ORAL | Status: AC
  Administered 2019-12-22: 4 mg via ORAL

## 2019-12-22 MED ORDER — ONDANSETRON HCL 4 MG/2ML IJ SOLN
4.0000 mg | Freq: Once | INTRAMUSCULAR | Status: AC
  Administered 2019-12-22: 4 mg via INTRAVENOUS
  Filled 2019-12-22: qty 2

## 2019-12-22 MED ORDER — KETOROLAC TROMETHAMINE 30 MG/ML IJ SOLN
30.0000 mg | Freq: Once | INTRAMUSCULAR | Status: AC
  Administered 2019-12-22: 30 mg via INTRAMUSCULAR

## 2019-12-22 MED ORDER — MORPHINE SULFATE (PF) 4 MG/ML IV SOLN
4.0000 mg | Freq: Once | INTRAVENOUS | Status: AC
  Administered 2019-12-22: 4 mg via INTRAVENOUS
  Filled 2019-12-22: qty 1

## 2019-12-22 MED ORDER — ONDANSETRON 4 MG PO TBDP
ORAL_TABLET | ORAL | Status: AC
  Filled 2019-12-22: qty 1

## 2019-12-22 NOTE — ED Triage Notes (Addendum)
Patient transferred from [facility] reports persistent left flank pain this week with urinary frequency and dysuria , denies hematuria , she received Toradol IM and Zofran at urgent care

## 2019-12-22 NOTE — ED Notes (Signed)
Patient is being discharged from the Urgent Care and sent to the Emergency Department via POV. Per J. Defelici, patient is in need of higher level of care due to LLQ abdominal pain, left flank, nausea.Patient is aware and verbalizes understanding of plan of care.  Vitals:   12/22/19 1955  BP: (!) 133/93  Pulse: 91  Resp: 17  Temp: 99.8 F (37.7 C)  SpO2: 98%

## 2019-12-22 NOTE — ED Provider Notes (Signed)
[facility] EMERGENCY DEPARTMENT Provider Note   CSN: [id number] Arrival date & time: 12/22/19  2053     History Chief Complaint  Patient presents with  . Flank Pain    Virginia Ferguson is a 39 y.o. female.  The history is provided by the patient and medical records.  Flank Pain   Virginia Ferguson is a 39 y.o. female who presents to the Emergency Department complaining of flank pain.  She presents to the ED following referral from urgent care for flank pain since Monday.  Pain is located in the LLQ and radiates to the flank.  It is burning in nature, waxes and wanes, worse with urination.  Has associated fever.  Denies vomiting, diarrhea, constipation. Has nausea.  Has mild HA.  Has mild sob, no significant cough.    No prior medical problems.  Denies any chance of pregnancy.  Has a hx/o pyelonephritis that required hospitalization.  Has not been vaccinated for covid 19.      Past Medical History:  Diagnosis Date  . Medical history non-contributory     Patient Active Problem List   Diagnosis Date Noted  . Left flank pain 12/22/2019  . Left sided abdominal pain of unknown cause 12/22/2019    Past Surgical History:  Procedure Laterality Date  . NO PAST SURGERIES       OB History    Gravida  1   Para  1   Term  1   Preterm      AB      Living  1     SAB      TAB      Ectopic      Multiple      Live Births              Family History  Problem Relation Age of Onset  . Hypertension Mother   . Diabetes Mother     Social History   Tobacco Use  . Smoking status: Current Some Day Smoker    Packs/day: 0.50    Types: Cigarettes  . Smokeless tobacco: Never Used  Substance Use Topics  . Alcohol use: Yes    Comment: occasional  . Drug use: No    Home Medications Prior to Admission medications   Medication Sig Start Date End Date Taking? Authorizing Provider  ibuprofen (ADVIL) 600 MG tablet Take 1 tablet (600 mg total) by  mouth every 6 (six) hours as needed. 06/29/19   Domenick Gong, MD  metroNIDAZOLE (FLAGYL) 500 MG tablet Take 1 tablet (500 mg total) by mouth 2 (two) times daily. 06/30/19   Lamptey, Britta Mccreedy, MD    Allergies    Patient has no known allergies.  Review of Systems   Review of Systems  Genitourinary: Positive for flank pain.  All other systems reviewed and are negative.   Physical Exam Updated Vital Signs BP 113/70 (BP Location: Right Arm)   Pulse 74   Temp 100.1 F (37.8 C) (Oral)   Resp 15   Ht 5\' 4"  (1.626 m)   LMP 12/16/2019   SpO2 99%   BMI 53.21 kg/m   Physical Exam Vitals and nursing note reviewed.  Constitutional:      General: She is in acute distress.     Appearance: She is well-developed.  HENT:     Head: Normocephalic and atraumatic.  Cardiovascular:     Rate and Rhythm: Regular rhythm. Tachycardia present.     Heart sounds: No  murmur heard.   Pulmonary:     Effort: Pulmonary effort is normal. No respiratory distress.     Breath sounds: Normal breath sounds.  Chest:     Chest wall: Tenderness present.  Abdominal:     Palpations: Abdomen is soft.     Tenderness: There is no guarding or rebound.     Comments: Left lateral abdominal tenderness  Musculoskeletal:        General: No tenderness.  Skin:    General: Skin is warm and dry.  Neurological:     Mental Status: She is alert and oriented to person, place, and time.  Psychiatric:        Behavior: Behavior normal.     ED Results / Procedures / Treatments   Labs (all labs ordered are listed, but only abnormal results are displayed) Labs Reviewed  CBC WITH DIFFERENTIAL/PLATELET - Abnormal; Notable for the following components:      Result Value   Hemoglobin 9.8 (*)    HCT 33.2 (*)    MCV 79.2 (*)    MCH 23.4 (*)    MCHC 29.5 (*)    RDW 18.3 (*)    All other components within normal limits  COMPREHENSIVE METABOLIC PANEL - Abnormal; Notable for the following components:   BUN <5 (*)     Calcium 11.0 (*)    Albumin 3.4 (*)    AST 14 (*)    All other components within normal limits  URINALYSIS, ROUTINE W REFLEX MICROSCOPIC - Abnormal; Notable for the following components:   APPearance CLOUDY (*)    Leukocytes,Ua MODERATE (*)    Bacteria, UA FEW (*)    All other components within normal limits  D-DIMER, QUANTITATIVE (NOT AT [facility]) - Abnormal; Notable for the following components:   D-Dimer, Quant 1.06 (*)    All other components within normal limits  URINE CULTURE  RESPIRATORY PANEL BY RT PCR (FLU A&B, COVID)  I-STAT BETA HCG BLOOD, ED ([facility], [facility], [facility] ONLY)  TROPONIN I (HIGH SENSITIVITY)  TROPONIN I (HIGH SENSITIVITY)    EKG EKG Interpretation  Date/Time:  Friday December 22 2019 22:37:44 EST Ventricular Rate:  68 PR Interval:    QRS Duration: 75 QT Interval:  356 QTC Calculation: 379 R Axis:   36 Text Interpretation: Sinus arrhythmia Confirmed by Tilden Fossa [phone]) on 12/22/2019 10:52:44 PM   Radiology CT Angio Chest PE W/Cm &/Or Wo Cm  Result Date: 12/23/2019 CLINICAL DATA:  Dyspnea, chest tightness EXAM: CT ANGIOGRAPHY CHEST WITH CONTRAST TECHNIQUE: Multidetector CT imaging of the chest was performed using the standard protocol during bolus administration of intravenous contrast. Multiplanar CT image reconstructions and MIPs were obtained to evaluate the vascular anatomy. CONTRAST:  OMNIPAQUE IOHEXOL 350 MG/ML SOLN COMPARISON:  None. FINDINGS: Cardiovascular: Satisfactory opacification of the pulmonary arteries to the segmental level. No evidence of pulmonary embolism. Normal heart size. No pericardial effusion. Central pulmonary arteries are of normal caliber. Aberrant origin of the right subclavian artery is noted. The thoracic aorta is otherwise unremarkable. 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 No surrounding inflammatory change. No pathologic mediastinal adenopathy. Esophagus unremarkable. Lungs/Pleura: Lungs are clear. No pleural effusion or pneumothorax. Upper Abdomen: No acute abnormality. Musculoskeletal: No chest wall abnormality. No acute or significant osseous findings. Review of the MIP images confirms the above findings. 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 Variant arch anatomy. Otherwise unremarkable examination. No pulmonary embolism. Electronically Signed   By: Helyn Numbers MD   On: 12/23/2019 00:35   CT Abdomen Pelvis W Contrast  Result Date: 12/23/2019 CLINICAL DATA:  Abdominal pain and fever. EXAM: CT ABDOMEN AND PELVIS WITH CONTRAST TECHNIQUE: Multidetector CT imaging of the abdomen and pelvis was performed using the standard protocol following bolus administration of intravenous contrast. CONTRAST:  OMNIPAQUE IOHEXOL 350 MG/ML SOLN COMPARISON:  06/05/2017 FINDINGS: Lower chest: The lung bases are clear. The heart size is normal. Hepatobiliary: There is probable hepatic steatosis. Normal gallbladder.There is no biliary ductal dilation. Pancreas: Normal contours without ductal dilatation. No peripancreatic fluid collection. Spleen: The spleen is borderline enlarged. Adrenals/Urinary Tract: --Adrenal glands: Unremarkable. --Right kidney/ureter: No hydronephrosis or radiopaque kidney stones. --Left kidney/ureter: There is mild left-sided hydronephrosis secondary to an obstructing 5 mm stone in the proximal left ureter (axial series 6, image 48). No additional stones are noted involving the left kidney. The left kidney is heterogeneously enhancing which raises concern for underlying pyelonephritis. --Urinary bladder: Unremarkable. Stomach/Bowel: --Stomach/Duodenum: No hiatal hernia or other gastric abnormality. Normal duodenal course and caliber. --Small bowel: Unremarkable. --Colon:  Unremarkable. --Appendix: Normal. Vascular/Lymphatic: Normal course and caliber of the major abdominal vessels. --No retroperitoneal lymphadenopathy. --No mesenteric lymphadenopathy. --No pelvic or inguinal lymphadenopathy. Reproductive: There is a small left ovarian cyst measuring approximately 3.5 cm. No further follow-up is required. Other: No ascites or free air. The abdominal wall is normal. Musculoskeletal. No acute displaced fractures. IMPRESSION: Mild left-sided hydronephrosis secondary to an obstructing 5 mm stone in the proximal left ureter. The left kidney is heterogeneously enhancing which raises concern for underlying pyelonephritis. There is probable hepatic steatosis. Electronically Signed   By: Katherine Mantle M.D.   On: 12/23/2019 00:36   DG Chest Port 1 View  Result Date: 12/22/2019 CLINICAL DATA:  Chest pain and fever EXAM: PORTABLE CHEST 1 VIEW COMPARISON:  11/28/2014 FINDINGS: The heart size and mediastinal contours are within normal limits. Both lungs are clear. The visualized skeletal structures are unremarkable. IMPRESSION: No active disease. Electronically Signed   By: Jasmine Pang M.D.   On: 12/22/2019 22:20    Procedures Procedures (including critical care time)  Medications Ordered in ED Medications  sodium chloride 0.9 % bolus 1,000 mL (0 mLs Intravenous Stopped 12/22/19 2346)  morphine 4 MG/ML injection 4 mg (4 mg Intravenous Given 12/22/19 2206)  ondansetron (ZOFRAN) injection 4 mg (4 mg Intravenous Given 12/22/19 2206)  cefTRIAXone (ROCEPHIN) 1 g in sodium chloride 0.9 % 100 mL IVPB (0 g Intravenous Stopped 12/22/19 2354)  iohexol (OMNIPAQUE) 350 MG/ML injection 100 mL (100 mLs Intravenous Contrast Given 12/23/19 0006)    ED Course  I have reviewed the triage vital signs and the nursing notes.  Pertinent labs & imaging results that were available during my care of the patient were reviewed by me and considered in my medical decision making (see chart for  details).    MDM Rules/Calculators/A&P                         patient here for evaluation of left flank pain. She is ill appearing on evaluation. UA concerning for UTI, will start antibiotics. Given her shortness of breath, D dimer obtained, which is elevated. Will get a CT scan. Plan to obtain CT stone study to rule out kidney stone. Patient care transferred pending imaging.  Final  Clinical Impression(s) / ED Diagnoses Final diagnoses:  None    Rx / DC Orders ED Discharge Orders    None       Tilden Fossaees, Vetra Shinall, MD 12/23/19 [phone]

## 2019-12-22 NOTE — ED Provider Notes (Signed)
[facility]    CSN: [id number] Arrival date & time: 12/22/19  1719      History   Chief Complaint Chief Complaint  Patient presents with  . Flank Pain    HPI Virginia Ferguson is a 39 y.o. female.   The history is provided by the patient. No language interpreter was used.  Flank Pain This is a new problem. The current episode started more than 2 days ago (4 days). The problem occurs constantly. The problem has not changed since onset.Associated symptoms include abdominal pain and headaches. Pertinent negatives include no chest pain and no shortness of breath. The symptoms are aggravated by walking (urination). Nothing relieves the symptoms. She has tried nothing for the symptoms.    Past Medical History:  Diagnosis Date  . Medical history non-contributory     Patient Active Problem List   Diagnosis Date Noted  . Left flank pain 12/22/2019  . Left sided abdominal pain of unknown cause 12/22/2019    Past Surgical History:  Procedure Laterality Date  . NO PAST SURGERIES      OB History    Gravida  1   Para  1   Term  1   Preterm      AB      Living  1     SAB      TAB      Ectopic      Multiple      Live Births               Home Medications    Prior to Admission medications   Medication Sig Start Date End Date Taking? Authorizing Provider  ibuprofen (ADVIL) 600 MG tablet Take 1 tablet (600 mg total) by mouth every 6 (six) hours as needed. 06/29/19   Domenick Gong, MD  metroNIDAZOLE (FLAGYL) 500 MG tablet Take 1 tablet (500 mg total) by mouth 2 (two) times daily. 06/30/19   LampteyBritta Mccreedy, MD    Family History Family History  Problem Relation Age of Onset  . Hypertension Mother   . Diabetes Mother     Social History Social History   Tobacco Use  . Smoking status: Current Some Day Smoker    Packs/day: 0.50    Types: Cigarettes  . Smokeless tobacco: Never Used  Substance Use Topics  . Alcohol use: Yes    Comment:  occasional  . Drug use: No     Allergies   Patient has no known allergies.   Review of Systems Review of Systems  Constitutional: Positive for chills and fever.  Respiratory: Negative for shortness of breath.   Cardiovascular: Negative for chest pain.  Gastrointestinal: Positive for abdominal pain and nausea. Negative for constipation, diarrhea and rectal pain.       Obese  Genitourinary: Positive for flank pain, frequency and urgency. Negative for decreased urine volume, difficulty urinating, dysuria, pelvic pain, vaginal bleeding, vaginal discharge and vaginal pain.       LMP 12/16/2019  Musculoskeletal: Positive for back pain.  Neurological: Positive for headaches.  All other systems reviewed and are negative.    Physical Exam Triage Vital Signs ED Triage Vitals  Enc Vitals Group     BP 12/22/19 1955 (!) 133/93     Pulse Rate 12/22/19 1955 91     Resp 12/22/19 1955 17     Temp 12/22/19 1955 99.8 F (37.7 C)     Temp Source 12/22/19 1955 Oral     SpO2 12/22/19  1955 98 %     Weight --      Height --      Head Circumference --      Peak Flow --      Pain Score 12/22/19 1952 9     Pain Loc --      Pain Edu? --      Excl. in GC? --    No data found.  Updated Vital Signs BP (!) 133/93 (BP Location: Left Wrist)   Pulse 91   Temp 99.8 F (37.7 C) (Oral)   Resp 17   LMP 12/16/2019   SpO2 98%   Visual Acuity Right Eye Distance:   Left Eye Distance:   Bilateral Distance:    Right Eye Near:   Left Eye Near:    Bilateral Near:     Physical Exam Vitals and nursing note reviewed.  Constitutional:      Appearance: Normal appearance. She is well-developed.  HENT:     Head: Normocephalic.     Right Ear: Tympanic membrane normal.     Left Ear: Tympanic membrane normal.     Nose: Nose normal.     Mouth/Throat:     Pharynx: Uvula midline.  Neck:     Trachea: Trachea normal.  Cardiovascular:     Rate and Rhythm: Normal rate and regular rhythm.     Pulses:  Normal pulses.     Heart sounds: Normal heart sounds.  Pulmonary:     Effort: Pulmonary effort is normal.     Breath sounds: Normal breath sounds.  Abdominal:     General: Bowel sounds are normal.     Tenderness: There is abdominal tenderness in the left lower quadrant. There is left CVA tenderness. There is no rebound.     Comments: Obese, LLQ pain  Musculoskeletal:        General: Normal range of motion.     Cervical back: Normal range of motion.  Neurological:     General: No focal deficit present.     Mental Status: She is alert.     GCS: GCS eye subscore is 4. GCS verbal subscore is 5. GCS motor subscore is 6.     Cranial Nerves: No cranial nerve deficit.     Sensory: No sensory deficit.  Psychiatric:        Mood and Affect: Mood normal.        Speech: Speech normal.        Behavior: Behavior normal. Behavior is cooperative.      UC Treatments / Results  Labs (all labs ordered are listed, but only abnormal results are displayed) Labs Reviewed  POCT URINALYSIS DIPSTICK, ED / UC - Abnormal; Notable for the following components:      Result Value   Hgb urine dipstick TRACE (*)    pH 8.5 (*)    Leukocytes,Ua SMALL (*)    All other components within normal limits    EKG   Radiology No results found.  Procedures Procedures (including critical care time)  Medications Ordered in UC Medications  ketorolac (TORADOL) 30 MG/ML injection 30 mg (30 mg Intramuscular Given 12/22/19 2034)  ondansetron (ZOFRAN-ODT) disintegrating tablet 4 mg (4 mg Oral Given 12/22/19 2033)    Initial Impression / Assessment and Plan / UC Course  I have reviewed the triage vital signs and the nursing notes.  Pertinent labs & imaging results that were available during my care of the patient were reviewed by me and considered in my medical  decision making (see chart for details).     UA essentially negative does not correlate with 9/10 pain. Temp 99.8, pt denies vaginal discharge or  bleeding, + nausea,no vomiting, last BM today reports normal. Denies trauma or injury. Recommend pt got ot Er for furhter evaluation of abd pain/flank pain,fever. DDX: Diverticulitis, PID, obstructing kidney stone, viral illness Final Clinical Impressions(s) / UC Diagnoses   Final diagnoses:  Left flank pain  Left sided abdominal pain of unknown cause     Discharge Instructions     Go to [facility] ER for further evaluation of 9/10 left sided abd /flank pain x 4 days. Do not eat or drink anything until seen by ER provider.    ED Prescriptions    None     PDMP not reviewed this encounter.   Clancy Gourd, NP 12/22/19 2051

## 2019-12-22 NOTE — ED Triage Notes (Signed)
Pt c/o left flank pain onset Monday, also reports burning with urination, urinary frequency, urgency for past 4 days, nausea, chills  Denies v/d, fever, vaginal discharge No meds PTA for symptoms

## 2019-12-22 NOTE — Discharge Instructions (Addendum)
Go to [facility] ER for further evaluation of 9/10 left sided abd /flank pain x 4 days. Do not eat or drink anything until seen by ER provider.

## 2019-12-23 ENCOUNTER — Emergency Department (HOSPITAL_COMMUNITY): Payer: Self-pay | Admitting: Certified Registered Nurse Anesthetist

## 2019-12-23 ENCOUNTER — Emergency Department (HOSPITAL_COMMUNITY): Payer: Self-pay

## 2019-12-23 ENCOUNTER — Encounter (HOSPITAL_COMMUNITY)
Admission: EM | Disposition: A | Discharge status: Home / Self Care | Payer: Self-pay | Source: Home / Self Care | Attending: Urology

## 2019-12-23 ENCOUNTER — Other Ambulatory Visit (HOSPITAL_COMMUNITY): Payer: Medicaid Other

## 2019-12-23 ENCOUNTER — Observation Stay (HOSPITAL_COMMUNITY): Payer: Self-pay

## 2019-12-23 ENCOUNTER — Encounter (HOSPITAL_COMMUNITY): Payer: Self-pay | Admitting: Emergency Medicine

## 2019-12-23 DIAGNOSIS — N201 Calculus of ureter: Secondary | ICD-10-CM | POA: Diagnosis present

## 2019-12-23 HISTORY — PX: CYSTOSCOPY W/ URETERAL STENT PLACEMENT: SHX1429

## 2019-12-23 LAB — TROPONIN I (HIGH SENSITIVITY): Troponin I (High Sensitivity): 2 ng/L (ref <18)

## 2019-12-23 LAB — CBC
HCT: 33.4 % — ABNORMAL LOW (ref 36.0–46.0)
Hemoglobin: 9.8 g/dL — ABNORMAL LOW (ref 12.0–15.0)
MCH: 23.9 pg — ABNORMAL LOW (ref 26.0–34.0)
MCHC: 29.3 g/dL — ABNORMAL LOW (ref 30.0–36.0)
MCV: 81.5 fL (ref 80.0–100.0)
Platelets: 240 10*3/uL (ref 150–400)
RBC: 4.1 MIL/uL (ref 3.87–5.11)
RDW: 18.4 % — ABNORMAL HIGH (ref 11.5–15.5)
WBC: 6 10*3/uL (ref 4.0–10.5)
nRBC: 0 % (ref 0.0–0.2)

## 2019-12-23 LAB — BASIC METABOLIC PANEL
Anion gap: 7 (ref 5–15)
BUN: 5 mg/dL — ABNORMAL LOW (ref 6–20)
CO2: 23 mmol/L (ref 22–32)
Calcium: 10.6 mg/dL — ABNORMAL HIGH (ref 8.9–10.3)
Chloride: 109 mmol/L (ref 98–111)
Creatinine, Ser: 0.68 mg/dL (ref 0.44–1.00)
GFR, Estimated: >60 mL/min (ref >60)
Glucose, Bld: 113 mg/dL — ABNORMAL HIGH (ref 70–99)
Potassium: 4.6 mmol/L (ref 3.5–5.1)
Sodium: 139 mmol/L (ref 135–145)

## 2019-12-23 LAB — RESPIRATORY PANEL BY RT PCR (FLU A&B, COVID)
Influenza A by PCR: NEGATIVE
Influenza B by PCR: NEGATIVE
SARS Coronavirus 2 by RT PCR: NEGATIVE

## 2019-12-23 SURGERY — CYSTOSCOPY, WITH RETROGRADE PYELOGRAM AND URETERAL STENT INSERTION
Anesthesia: General | Laterality: Left

## 2019-12-23 MED ORDER — FENTANYL CITRATE (PF) 250 MCG/5ML IJ SOLN
INTRAMUSCULAR | Status: DC | PRN
  Administered 2019-12-23: 100 ug via INTRAVENOUS
  Administered 2019-12-23 (×2): 50 ug via INTRAVENOUS

## 2019-12-23 MED ORDER — DEXTROSE 5 % IV SOLN
INTRAVENOUS | Status: DC | PRN
  Administered 2019-12-23: 3 g via INTRAVENOUS

## 2019-12-23 MED ORDER — DIPHENHYDRAMINE HCL 50 MG/ML IJ SOLN
INTRAMUSCULAR | Status: AC
  Filled 2019-12-23: qty 1

## 2019-12-23 MED ORDER — ACETAMINOPHEN 325 MG PO TABS
650.0000 mg | ORAL_TABLET | ORAL | Status: DC | PRN
  Administered 2019-12-23: 650 mg via ORAL
  Filled 2019-12-23: qty 2

## 2019-12-23 MED ORDER — DEXAMETHASONE SODIUM PHOSPHATE 10 MG/ML IJ SOLN
INTRAMUSCULAR | Status: DC | PRN
  Administered 2019-12-23: 10 mg via INTRAVENOUS

## 2019-12-23 MED ORDER — MIDAZOLAM HCL 2 MG/2ML IJ SOLN
INTRAMUSCULAR | Status: DC | PRN
  Administered 2019-12-23: 2 mg via INTRAVENOUS

## 2019-12-23 MED ORDER — FENTANYL CITRATE (PF) 100 MCG/2ML IJ SOLN
INTRAMUSCULAR | Status: AC
  Filled 2019-12-23: qty 2

## 2019-12-23 MED ORDER — IOHEXOL 350 MG/ML SOLN
100.0000 mL | Freq: Once | INTRAVENOUS | Status: AC | PRN
  Administered 2019-12-23: 100 mL via INTRAVENOUS

## 2019-12-23 MED ORDER — POLYETHYLENE GLYCOL 3350 17 G PO PACK: 17.0000 g | PACK | Freq: Every day | ORAL | Status: DC | PRN

## 2019-12-23 MED ORDER — ONDANSETRON HCL 4 MG/2ML IJ SOLN
INTRAMUSCULAR | Status: AC
  Filled 2019-12-23: qty 2

## 2019-12-23 MED ORDER — DIPHENHYDRAMINE HCL 50 MG/ML IJ SOLN
12.5000 mg | Freq: Four times a day (QID) | INTRAMUSCULAR | Status: DC | PRN
  Administered 2019-12-23: 25 mg via INTRAVENOUS
  Filled 2019-12-23: qty 1

## 2019-12-23 MED ORDER — LACTATED RINGERS IV SOLN: INTRAVENOUS | Status: DC | PRN

## 2019-12-23 MED ORDER — TAMSULOSIN HCL 0.4 MG PO CAPS
0.4000 mg | ORAL_CAPSULE | Freq: Every day | ORAL | 2 refills | Status: DC
Start: 2019-12-23 — End: 2021-05-11

## 2019-12-23 MED ORDER — DOCUSATE SODIUM 100 MG PO CAPS
100.0000 mg | ORAL_CAPSULE | Freq: Two times a day (BID) | ORAL | Status: DC
  Administered 2019-12-23 – 2019-12-24 (×4): 100 mg via ORAL
  Filled 2019-12-23 (×5): qty 1

## 2019-12-23 MED ORDER — DIPHENHYDRAMINE HCL 50 MG/ML IJ SOLN
6.2500 mg | Freq: Two times a day (BID) | INTRAMUSCULAR | Status: AC | PRN
  Administered 2019-12-23 (×2): 6.5 mg via INTRAVENOUS

## 2019-12-23 MED ORDER — ONDANSETRON HCL 4 MG/2ML IJ SOLN: 4.0000 mg | INTRAMUSCULAR | Status: DC | PRN

## 2019-12-23 MED ORDER — HYDROMORPHONE HCL 1 MG/ML IJ SOLN
INTRAMUSCULAR | Status: AC
  Filled 2019-12-23: qty 1

## 2019-12-23 MED ORDER — LIDOCAINE 2% (20 MG/ML) 5 ML SYRINGE
INTRAMUSCULAR | Status: DC | PRN
  Administered 2019-12-23: 100 mg via INTRAVENOUS

## 2019-12-23 MED ORDER — HYDROMORPHONE HCL 1 MG/ML IJ SOLN
0.5000 mg | INTRAMUSCULAR | Status: DC | PRN
  Administered 2019-12-23: 0.5 mg via INTRAVENOUS

## 2019-12-23 MED ORDER — SUGAMMADEX SODIUM 200 MG/2ML IV SOLN
INTRAVENOUS | Status: DC | PRN
  Administered 2019-12-23: 100 mg via INTRAVENOUS
  Administered 2019-12-23: 200 mg via INTRAVENOUS

## 2019-12-23 MED ORDER — FENTANYL CITRATE (PF) 250 MCG/5ML IJ SOLN
INTRAMUSCULAR | Status: AC
  Filled 2019-12-23: qty 5

## 2019-12-23 MED ORDER — DIPHENHYDRAMINE-ZINC ACETATE 2-0.1 % EX CREA
TOPICAL_CREAM | Freq: Two times a day (BID) | CUTANEOUS | Status: DC | PRN
  Filled 2019-12-23: qty 28

## 2019-12-23 MED ORDER — OXYBUTYNIN CHLORIDE ER 10 MG PO TB24
10.0000 mg | ORAL_TABLET | Freq: Every day | ORAL | Status: DC
  Administered 2019-12-23 – 2019-12-24 (×2): 10 mg via ORAL
  Filled 2019-12-23 (×2): qty 1

## 2019-12-23 MED ORDER — ZOLPIDEM TARTRATE 5 MG PO TABS
5.0000 mg | ORAL_TABLET | Freq: Every evening | ORAL | Status: DC | PRN
  Administered 2019-12-23: 5 mg via ORAL
  Filled 2019-12-23: qty 1

## 2019-12-23 MED ORDER — HEPARIN SODIUM (PORCINE) 5000 UNIT/ML IJ SOLN: 5000.0000 [IU] | Freq: Three times a day (TID) | INTRAMUSCULAR | Status: DC

## 2019-12-23 MED ORDER — MIDAZOLAM HCL 2 MG/2ML IJ SOLN
INTRAMUSCULAR | Status: AC
  Filled 2019-12-23: qty 2

## 2019-12-23 MED ORDER — BISACODYL 5 MG PO TBEC
5.0000 mg | DELAYED_RELEASE_TABLET | Freq: Every day | ORAL | Status: DC | PRN
  Administered 2019-12-23 – 2019-12-24 (×2): 5 mg via ORAL
  Filled 2019-12-23 (×2): qty 1

## 2019-12-23 MED ORDER — HEPARIN SODIUM (PORCINE) 5000 UNIT/ML IJ SOLN
5000.0000 [IU] | Freq: Three times a day (TID) | INTRAMUSCULAR | Status: DC
  Administered 2019-12-23 – 2019-12-25 (×4): 5000 [IU] via SUBCUTANEOUS
  Filled 2019-12-23 (×6): qty 1

## 2019-12-23 MED ORDER — DOCUSATE SODIUM 100 MG PO CAPS: 100.0000 mg | ORAL_CAPSULE | Freq: Every day | ORAL | 0 refills | Status: DC | PRN

## 2019-12-23 MED ORDER — OXYBUTYNIN CHLORIDE ER 10 MG PO TB24
10.0000 mg | ORAL_TABLET | Freq: Every day | ORAL | Status: DC
  Filled 2019-12-23: qty 1

## 2019-12-23 MED ORDER — DOCUSATE SODIUM 100 MG PO CAPS: 100.0000 mg | ORAL_CAPSULE | Freq: Two times a day (BID) | ORAL | Status: DC

## 2019-12-23 MED ORDER — SUCCINYLCHOLINE CHLORIDE 200 MG/10ML IV SOSY
PREFILLED_SYRINGE | INTRAVENOUS | Status: DC | PRN
  Administered 2019-12-23: 140 mg via INTRAVENOUS

## 2019-12-23 MED ORDER — HYDROMORPHONE HCL 1 MG/ML IJ SOLN
0.5000 mg | INTRAMUSCULAR | Status: DC | PRN
  Administered 2019-12-23: 1 mg via INTRAVENOUS
  Administered 2019-12-24: 0.5 mg via INTRAVENOUS
  Administered 2019-12-24: 1 mg via INTRAVENOUS
  Filled 2019-12-23 (×3): qty 1

## 2019-12-23 MED ORDER — OXYBUTYNIN CHLORIDE ER 10 MG PO TB24
10.0000 mg | ORAL_TABLET | Freq: Every day | ORAL | 2 refills | Status: DC
Start: 2019-12-23 — End: 2020-01-10

## 2019-12-23 MED ORDER — OXYCODONE-ACETAMINOPHEN 5-325 MG PO TABS
1.0000 | ORAL_TABLET | ORAL | Status: DC | PRN
  Administered 2019-12-23: 1 via ORAL
  Administered 2019-12-23: 2 via ORAL
  Administered 2019-12-24 – 2019-12-25 (×5): 1 via ORAL
  Filled 2019-12-23 (×5): qty 1
  Filled 2019-12-23: qty 2
  Filled 2019-12-23: qty 1

## 2019-12-23 MED ORDER — SODIUM CHLORIDE 0.9 % IV SOLN: INTRAVENOUS | Status: DC

## 2019-12-23 MED ORDER — IBUPROFEN 400 MG PO TABS
600.0000 mg | ORAL_TABLET | Freq: Four times a day (QID) | ORAL | Status: DC | PRN
  Administered 2019-12-23: 600 mg via ORAL
  Filled 2019-12-23 (×2): qty 1

## 2019-12-23 MED ORDER — DEXAMETHASONE SODIUM PHOSPHATE 10 MG/ML IJ SOLN
INTRAMUSCULAR | Status: AC
  Filled 2019-12-23: qty 1

## 2019-12-23 MED ORDER — BELLADONNA ALKALOIDS-OPIUM 16.2-30 MG RE SUPP: 1.0000 | Freq: Three times a day (TID) | RECTAL | Status: DC | PRN

## 2019-12-23 MED ORDER — DIPHENHYDRAMINE HCL 12.5 MG/5ML PO ELIX
12.5000 mg | ORAL_SOLUTION | Freq: Four times a day (QID) | ORAL | Status: DC | PRN
  Administered 2019-12-23: 12.5 mg via ORAL
  Administered 2019-12-24 (×2): 25 mg via ORAL
  Filled 2019-12-23 (×3): qty 10

## 2019-12-23 MED ORDER — FENTANYL CITRATE (PF) 100 MCG/2ML IJ SOLN
25.0000 ug | INTRAMUSCULAR | Status: DC | PRN
  Administered 2019-12-23 (×3): 50 ug via INTRAVENOUS

## 2019-12-23 MED ORDER — PROPOFOL 10 MG/ML IV BOLUS
INTRAVENOUS | Status: AC
  Filled 2019-12-23: qty 20

## 2019-12-23 MED ORDER — IOHEXOL 300 MG/ML  SOLN
INTRAMUSCULAR | Status: DC | PRN
  Administered 2019-12-23: 50 mL via URETHRAL

## 2019-12-23 MED ORDER — TAMSULOSIN HCL 0.4 MG PO CAPS
0.4000 mg | ORAL_CAPSULE | Freq: Every day | ORAL | Status: DC
  Administered 2019-12-23 – 2019-12-24 (×2): 0.4 mg via ORAL
  Filled 2019-12-23 (×2): qty 1

## 2019-12-23 MED ORDER — ONDANSETRON HCL 4 MG/2ML IJ SOLN
INTRAMUSCULAR | Status: DC | PRN
  Administered 2019-12-23: 4 mg via INTRAVENOUS

## 2019-12-23 MED ORDER — OXYCODONE-ACETAMINOPHEN 5-325 MG PO TABS
1.0000 | ORAL_TABLET | ORAL | 0 refills | Status: DC | PRN
Start: 2019-12-23 — End: 2020-01-15

## 2019-12-23 MED ORDER — PROPOFOL 10 MG/ML IV BOLUS
INTRAVENOUS | Status: DC | PRN
  Administered 2019-12-23: 180 mg via INTRAVENOUS

## 2019-12-23 MED ORDER — SODIUM CHLORIDE 0.9 % IR SOLN
Status: DC | PRN
  Administered 2019-12-23: 3000 mL

## 2019-12-23 MED ORDER — GENTAMICIN SULFATE 40 MG/ML IJ SOLN
650.0000 mg | INTRAVENOUS | Status: DC
  Administered 2019-12-23 – 2019-12-25 (×3): 650 mg via INTRAVENOUS
  Filled 2019-12-23 (×3): qty 16.25

## 2019-12-23 MED ORDER — ROCURONIUM BROMIDE 10 MG/ML (PF) SYRINGE
PREFILLED_SYRINGE | INTRAVENOUS | Status: DC | PRN
  Administered 2019-12-23: 50 mg via INTRAVENOUS
  Administered 2019-12-23: 10 mg via INTRAVENOUS

## 2019-12-23 MED ORDER — KETOROLAC TROMETHAMINE 15 MG/ML IJ SOLN
15.0000 mg | Freq: Four times a day (QID) | INTRAMUSCULAR | Status: DC
  Administered 2019-12-23 – 2019-12-25 (×8): 15 mg via INTRAVENOUS
  Filled 2019-12-23 (×8): qty 1

## 2019-12-23 SURGICAL SUPPLY — 25 items
BAG URO CATCHER STRL LF (MISCELLANEOUS) ×3 IMPLANT
CATH INTERMIT  6FR 70CM (CATHETERS) ×4 IMPLANT
CLOTH BEACON ORANGE TIMEOUT ST (SAFETY) ×3 IMPLANT
EXTRACTOR STONE NITINOL NGAGE (UROLOGICAL SUPPLIES) IMPLANT
GLOVE BIOGEL M 7.0 STRL (GLOVE) ×3 IMPLANT
GOWN STRL REUS W/TWL LRG LVL3 (GOWN DISPOSABLE) ×3 IMPLANT
GUIDEWIRE ANG ZIPWIRE 038X150 (WIRE) ×2 IMPLANT
GUIDEWIRE STR DUAL SENSOR (WIRE) ×10 IMPLANT
IV NS 1000ML (IV SOLUTION) ×3
IV NS 1000ML BAXH (IV SOLUTION) ×1 IMPLANT
KIT TURNOVER KIT A (KITS) ×2 IMPLANT
LASER FIB FLEXIVA PULSE ID 365 (Laser) IMPLANT
MANIFOLD NEPTUNE II (INSTRUMENTS) ×3 IMPLANT
PACK CYSTO (CUSTOM PROCEDURE TRAY) ×3 IMPLANT
SHEATH URETERAL 12FRX28CM (UROLOGICAL SUPPLIES) IMPLANT
SHEATH URETERAL 12FRX35CM (MISCELLANEOUS) IMPLANT
SHEATH URETERAL 12FRX55CM (UROLOGICAL SUPPLIES) IMPLANT
STENT INLAY 6FR 22/32CM (STENTS) ×2 IMPLANT
STENT URET 6FRX26 CONTOUR (STENTS) ×2 IMPLANT
STENT URET 6FRX28 CONTOUR (STENTS) ×2 IMPLANT
TRACTIP FLEXIVA PULS ID 200XHI (Laser) IMPLANT
TRACTIP FLEXIVA PULSE ID 200 (Laser)
TUBING CONNECTING 10 (TUBING) ×2 IMPLANT
TUBING CONNECTING 10' (TUBING) ×1
TUBING UROLOGY SET (TUBING) ×3 IMPLANT

## 2019-12-23 NOTE — Anesthesia Procedure Notes (Signed)
Procedure Name: Intubation Date/Time: 12/23/2019 3:30 AM Performed by: Alease Medina, CRNA Pre-anesthesia Checklist: Patient identified, Emergency Drugs available, Suction available and Patient being monitored Patient Re-evaluated:Patient Re-evaluated prior to induction Oxygen Delivery Method: Circle system utilized Preoxygenation: Pre-oxygenation with 100% oxygen Induction Type: IV induction, Rapid sequence and Cricoid Pressure applied Laryngoscope Size: Glidescope and 3 Grade View: Grade I Tube type: Oral Tube size: 7.0 mm Number of attempts: 1 Airway Equipment and Method: Video-laryngoscopy and Rigid stylet Placement Confirmation: ETT inserted through vocal cords under direct vision,  positive ETCO2 and breath sounds checked- equal and bilateral Secured at: 22 cm Tube secured with: Tape Dental Injury: Teeth and Oropharynx as per pre-operative assessment

## 2019-12-23 NOTE — Progress Notes (Addendum)
Patient requested if he could talk to the MD to get an order to let her daughter stay with her. RN and outgoing Staff discussed the visitation policy with her and daughter. Previous RN call MD to see if an order can be made but MD mare [place] aware, he was not doing that. Patient was communicated with the feedback.

## 2019-12-23 NOTE — Progress Notes (Signed)
Page MD regarding pt pain management  Pt states dilaudid and percocet have caused her to have itching head to toe Lotion and PRN benadryl provided No visible hives at this time  MD provided verbal order for ibuprofen 600 mg PO q 6hr PRN and benadryl cream  Will continue to monitor

## 2019-12-23 NOTE — Progress Notes (Signed)
Dr. Cardell Peach, please call patient's sister Darnelle Spangle with update [phone].

## 2019-12-23 NOTE — Transfer of Care (Signed)
Immediate Anesthesia Transfer of Care Note  Patient: Virginia Ferguson  Procedure(s) Performed: CYSTOSCOPY WITH LEFT RETROGRADE PYELOGRAM/URETERAL STENT PLACEMENT X2 (Left )  Patient Location: PACU  Anesthesia Type:General  Level of Consciousness: drowsy and patient cooperative  Airway & Oxygen Therapy: Patient Spontanous Breathing  Post-op Assessment: Report given to RN, Post -op Vital signs reviewed and stable and Patient moving all extremities X 4  Post vital signs: Reviewed and stable  Last Vitals:  Vitals Value Taken Time  BP    Temp    Pulse    Resp    SpO2      Last Pain:  Vitals:   12/23/19 0248  TempSrc: Oral  PainSc: 8          Complications: No complications documented.

## 2019-12-23 NOTE — Anesthesia Preprocedure Evaluation (Signed)
Anesthesia Evaluation  Patient identified by MRN, date of birth, ID band  Reviewed: Allergy & Precautions, NPO status , Patient's Chart, lab work & pertinent test resultsPreop documentation limited or incomplete due to emergent nature of procedure.  Airway Mallampati: II  TM Distance: >3 FB     Dental   Pulmonary Current Smoker,    breath sounds clear to auscultation       Cardiovascular negative cardio ROS   Rhythm:Regular Rate:Normal     Neuro/Psych    GI/Hepatic negative GI ROS, Neg liver ROS,   Endo/Other    Renal/GU Renal diseaseKidney stone CG     Musculoskeletal   Abdominal   Peds  Hematology   Anesthesia Other Findings   Reproductive/Obstetrics                             Anesthesia Physical Anesthesia Plan  ASA: II  Anesthesia Plan: General   Post-op Pain Management:    Induction: Intravenous  PONV Risk Score and Plan: 2 and Ondansetron, Dexamethasone and Midazolam  Airway Management Planned: Oral ETT  Additional Equipment:   Intra-op Plan:   Post-operative Plan: Possible Post-op intubation/ventilation  Informed Consent: I have reviewed the patients History and Physical, chart, labs and discussed the procedure including the risks, benefits and alternatives for the proposed anesthesia with the patient or authorized representative who has indicated his/her understanding and acceptance.     Dental advisory given  Plan Discussed with: CRNA and Anesthesiologist  Anesthesia Plan Comments:         Anesthesia Quick Evaluation

## 2019-12-23 NOTE — Progress Notes (Signed)
Virginia Ferguson is a 39 y.o. female who has been consulted for aminoglycoside dosing.  The patient has a current serum creatinine of  Creatinine, Ser (mg/dL)  Date Value  95/63/8756 0.65   which calculates to an estimated CrCl of Estimated Creatinine Clearance: 134.1 mL/min (by C-G formula based on SCr of 0.65 mg/dL).  Current weight is 140kg, ABW 89kg, indication UTI.  The patient will be started on gentamicin at a dose of 7 mg/kg/day per dosing Nomogram and adjusted body weight.  Patient will be followed by pharmacy for changes in renal function, toxicity, and efficacy.  Serum creatinines will be ordered as needed.  Vernard Gambles, PharmD, BCPS 12/23/2019 2:38 AM

## 2019-12-23 NOTE — Progress Notes (Signed)
RN went to patient room and make enquiries from her daughter if there was a problem getting home since she has been told about visiting hours policies about 2-3 times during the shift. Patient had attitude with the RN for asking her daughter that question. Patient angrily answering question and been hateful and got the conversation escalated. Patient pulled her phone and stated "I am going to record you and file a suit against you, what is your name?, Riley Lam Right?. RN told patient security need to informed to get things straighten up. RN left patient's room and made a call to the night administrator coverage to know about the situation. After security came and help patient's daughter to leave, patient requested for her nurse to be changed. Have no problem with that and so [place], the second  RN on the floor assumed care from Retail banker.

## 2019-12-23 NOTE — Op Note (Signed)
Operative Note  Preoperative diagnosis:  1.  Obstructing left ureteral stone 2.  Pyelonephritis  Postoperative diagnosis: 1.  Obstructing left ureteral stone 2.  Pyelonephritis  Procedure(s): 1.  Cystoscopy 2. Left retrograde pyelogram with interpretation 3. Left ureteral stent placement 4. Left diagnostic ureteroscopy  Surgeon: [name], MD  Assistants:  None  Anesthesia:  General  Complications:  None  EBL: Minimal  Specimens: 1.  ID Type Source Tests Collected by Time Destination  A : Left renal pelvis urine culture Urinary Urine, Cystoscope ANAEROBIC CULTURE Jannifer Hick, MD 12/23/2019 [phone]    Drains/Catheters: 1.  6 Fr x28 cm left ureteral stent into left lower pole moiety 2.  6 French x 22-32cm left ureteral stent into left upper pole moiety  Intraoperative findings:   1. Cystoscopy demonstrated no suspicious bladder lesions within the bladder.  Bilateral ureteral orifices were in their normal orthotopic position.  Left retrograde pyelogram demonstrated that she had a distended system with diversion of her left ureters near the left UVJ.  She did have a common left ureteral distal sheath.  As I suspected that the stone was in the left lower pole moiety, we did place a 6 [place] by 28 cm left ureteral stent this morning.  However to be sure, I did place a 6 [place] by 22-32 cm left ureteral stent into the left upper moiety.  Good proximal curls were seen within the left upper pole moiety, left lower pole moiety and bladder respectively at the end of the case.  Urine was seen emanating from the sideholes of the stent at the end of the case.  Indication:  Virginia Ferguson is a 39 y.o. female with obstructing 5 mm left proximal ureteral stone with signs of infection.  After reviewing the management options for treatment, she elected to proceed with the above surgical procedure(s). We have discussed the potential benefits and risks of the procedure, side effects of the  proposed treatment, the likelihood of the patient achieving the goals of the procedure, and any potential problems that might occur during the procedure or recuperation. Informed consent has been obtained.  Description of procedure: The patient was taken to the operating room and general anesthesia was induced.  The patient was placed in the dorsal lithotomy position, prepped and draped in the usual sterile fashion, and preoperative antibiotics were administered. A preoperative time-out was performed.   Cystourethroscopy was performed.  The patient's urethra was examined and was normal. The bladder was then systematically examined in its entirety. There was no evidence for any bladder tumors, stones, or other mucosal pathology.    Attention then turned to the left ureteral orifice and a ureteral catheter was used to intubate the ureteral orifice.  Omnipaque contrast was injected through the ureteral catheter and a retrograde pyelogram was performed with findings as dictated above.  Of note there were 2 separate ureters with a common left ureteral sheath  I had used the semirigid ureteroscope in order to pass a wire into the left lower pole moiety and then passed another wire into the left upper pole moiety.  Of these wires I then passed a 5 [place] open-ended catheter and performed a left retrograde pyelogram of each individual system as above.  I then replaced a zip wire in each moiety.  In the lower pole moiety I then placed a 6 French by 28 cm left ureteral stent under cystoscopic visualization.  A good curl was seen within the left upper pole of the left  lower pole moiety and a curl within the bladder.  In a similar fashion I then passed a 5 retrograde catheter over the left upper pole voiding performed a left retrograde pyelogram as above.  I then replaced a zip wire and then passed a 6 [place] by 22-32 cm ureteral stent into the left upper pole moiety.  A good curl was seen in the left upper pole  moiety and bladder respectively.  The bladder was then emptied and the procedure ended.  The patient appeared to tolerate the procedure well and without complications.  The patient was able to be awakened and transferred to the recovery unit in satisfactory condition.   Plan: Patient will remain in the hospital today.  We will follow for urine culture.  We will plan for discharge home tomorrow after receiving IV antibiotics for today.  She was discharged home on a culture specific antibiotic tomorrow.  We will plan to follow her up in the office to arrange for left ureteroscopy with laser lithotripsy of her stone.  Matt R. Morty Ortwein MD [facility]  Pager: [phone]

## 2019-12-23 NOTE — Discharge Instructions (Signed)
   Activity:  You are encouraged to ambulate frequently (about every hour during waking hours) to help prevent blood clots from forming in your legs or lungs.  However, you should not engage in any heavy lifting (> 10-15 lbs), strenuous activity, or straining.   Diet: You should advance your diet as instructed by your physician.  It will be normal to have some bloating, nausea, and abdominal discomfort intermittently.   Prescriptions:  You will be provided a prescription for pain medication to take as needed.  If your pain is not severe enough to require the prescription pain medication, you may take extra strength Tylenol instead which will have less side effects.  You should also take a prescribed stool softener to avoid straining with bowel movements as the prescription pain medication may constipate you.   What to call us about: You should call the office [phone]) if you develop fever > 101 or develop persistent vomiting. Activity:  You are encouraged to ambulate frequently (about every hour during waking hours) to help prevent blood clots from forming in your legs or lungs.  However, you should not engage in any heavy lifting (> 10-15 lbs), strenuous activity, or straining.  You have a left ureteral stent in place. This is temporary and will be exchanged during your surgery for definitive treatment of your ureteral stone.

## 2019-12-23 NOTE — Progress Notes (Signed)
Paged MD regarding constant pain not relieved with PRN medications  MD aware and will place new orders Report given to night shift

## 2019-12-23 NOTE — H&P (Signed)
H&P  Chief Complaint: Left flank pain  History of Present Illness: Virginia Ferguson is a 39 y.o. year old presents to the ED tonight with left flank pain for the past 5 days. This is been associate with some nausea but no vomiting. States pain is burning and radiates to her left flank and left lower quadrant. She denies dysuria or hematuria. She denies difficulty voiding. She denies prior history of urolithiasis.  CT A/P 12/23/2019 demonstrated mild left hydronephrosis secondary to obstructing 5 mm stone the proximal left ureter. Left kidney is heterogenous enhancing which raise concern for underlying pyelonephritis.  Past Medical History:  Diagnosis Date  . Medical history non-contributory     Past Surgical History:  Procedure Laterality Date  . NO PAST SURGERIES      Home Medications:  No current facility-administered medications on file prior to encounter.   Current Outpatient Medications on File Prior to Encounter  Medication Sig Dispense Refill  . ibuprofen (ADVIL) 600 MG tablet Take 1 tablet (600 mg total) by mouth every 6 (six) hours as needed. 30 tablet 0  . metroNIDAZOLE (FLAGYL) 500 MG tablet Take 1 tablet (500 mg total) by mouth 2 (two) times daily. 14 tablet 0     Allergies: No Known Allergies  Family History  Problem Relation Age of Onset  . Hypertension Mother   . Diabetes Mother     Social History:  reports that she has been smoking cigarettes. She has been smoking about 0.50 packs per day. She has never used smokeless tobacco. She reports current alcohol use. She reports that she does not use drugs.  ROS: A complete review of systems was performed.  All systems are negative except for pertinent findings as noted.  Physical Exam:  Vital signs in last 24 hours: Temp:  [99.8 F (37.7 C)-100.1 F (37.8 C)] 100.1 F (37.8 C) (11/12 2058) Pulse Rate:  [64-98] 71 (11/13 0115) Resp:  [10-25] 15 (11/13 0115) BP: (97-148)/(65-93) 148/88 (11/13 0115) SpO2:  [95  %-100 %] 100 % (11/13 0115) Constitutional:  Alert and oriented, No acute distress Cardiovascular: Regular rate and rhythm, No JVD Respiratory: Normal respiratory effort, Lungs clear bilaterally GI: Abdomen is soft, nontender, nondistended, no abdominal masses GU: No CVA tenderness Neurologic: Grossly intact, no focal deficits Psychiatric: Normal mood and affect  Laboratory Data:  Recent Labs    12/22/19 2124  WBC 7.1  HGB 9.8*  HCT 33.2*  PLT 255    Recent Labs    12/22/19 2124  NA 139  K 4.2  CL 106  GLUCOSE 95  BUN <5*  CALCIUM 11.0*  CREATININE 0.65     Results for orders placed or performed during the hospital encounter of 12/22/19 (from the past 24 hour(s))  Urinalysis, Routine w reflex microscopic Urine, Clean Catch     Status: Abnormal   Collection Time: 12/22/19  9:02 PM  Result Value Ref Range   Color, Urine YELLOW YELLOW   APPearance CLOUDY (A) CLEAR   Specific Gravity, Urine 1.010 1.005 - 1.030   pH 7.0 5.0 - 8.0   Glucose, UA NEGATIVE NEGATIVE mg/dL   Hgb urine dipstick NEGATIVE NEGATIVE   Bilirubin Urine NEGATIVE NEGATIVE   Ketones, ur NEGATIVE NEGATIVE mg/dL   Protein, ur NEGATIVE NEGATIVE mg/dL   Nitrite NEGATIVE NEGATIVE   Leukocytes,Ua MODERATE (A) NEGATIVE   RBC / HPF 0-5 0 - 5 RBC/hpf   WBC, UA 21-50 0 - 5 WBC/hpf   Bacteria, UA FEW (A) NONE SEEN  Squamous Epithelial / LPF 0-5 0 - 5   Mucus PRESENT   CBC with Differential     Status: Abnormal   Collection Time: 12/22/19  9:24 PM  Result Value Ref Range   WBC 7.1 4.0 - 10.5 K/uL   RBC 4.19 3.87 - 5.11 MIL/uL   Hemoglobin 9.8 (L) 12.0 - 15.0 g/dL   HCT 09.3 (L) 36 - 46 %   MCV 79.2 (L) 80.0 - 100.0 fL   MCH 23.4 (L) 26.0 - 34.0 pg   MCHC 29.5 (L) 30.0 - 36.0 g/dL   RDW 81.8 (H) 29.9 - 37.1 %   Platelets 255 150 - 400 K/uL   nRBC 0.0 0.0 - 0.2 %   Neutrophils Relative % 56 %   Neutro Abs 4.0 1.7 - 7.7 K/uL   Lymphocytes Relative 28 %   Lymphs Abs 2.0 0.7 - 4.0 K/uL   Monocytes  Relative 11 %   Monocytes Absolute 0.8 0.1 - 1.0 K/uL   Eosinophils Relative 4 %   Eosinophils Absolute 0.3 0.0 - 0.5 K/uL   Basophils Relative 1 %   Basophils Absolute 0.0 0.0 - 0.1 K/uL   Immature Granulocytes 0 %   Abs Immature Granulocytes 0.01 0.00 - 0.07 K/uL  Comprehensive metabolic panel     Status: Abnormal   Collection Time: 12/22/19  9:24 PM  Result Value Ref Range   Sodium 139 135 - 145 mmol/L   Potassium 4.2 3.5 - 5.1 mmol/L   Chloride 106 98 - 111 mmol/L   CO2 22 22 - 32 mmol/L   Glucose, Bld 95 70 - 99 mg/dL   BUN <5 (L) 6 - 20 mg/dL   Creatinine, Ser 6.96 0.44 - 1.00 mg/dL   Calcium 78.9 (H) 8.9 - 10.3 mg/dL   Total Protein 7.1 6.5 - 8.1 g/dL   Albumin 3.4 (L) 3.5 - 5.0 g/dL   AST 14 (L) 15 - 41 U/L   ALT 12 0 - 44 U/L   Alkaline Phosphatase 113 38 - 126 U/L   Total Bilirubin 0.6 0.3 - 1.2 mg/dL   GFR, Estimated >38 >10 mL/min   Anion gap 11 5 - 15  D-dimer, quantitative     Status: Abnormal   Collection Time: 12/22/19 10:08 PM  Result Value Ref Range   D-Dimer, Quant 1.06 (H) 0.00 - 0.50 ug/mL-FEU  Troponin I (High Sensitivity)     Status: None   Collection Time: 12/22/19 10:08 PM  Result Value Ref Range   Troponin I (High Sensitivity) 3 <18 ng/L  I-Stat Beta hCG blood, ED ([facility], [facility], [facility] only)     Status: None   Collection Time: 12/22/19 10:14 PM  Result Value Ref Range   I-stat hCG, quantitative <5.0 <5 mIU/mL   Comment 3          Troponin I (High Sensitivity)     Status: None   Collection Time: 12/23/19 12:44 AM  Result Value Ref Range   Troponin I (High Sensitivity) 2 <18 ng/L   No results found for this or any previous visit (from the past 240 hour(s)).  Renal Function: Recent Labs    12/22/19 2124  CREATININE 0.65   CrCl cannot be calculated (Unknown ideal weight.).  Radiologic Imaging: CT Angio Chest PE W/Cm &/Or Wo Cm  Result Date: 12/23/2019 CLINICAL DATA:  Dyspnea, chest tightness EXAM: CT ANGIOGRAPHY CHEST WITH CONTRAST TECHNIQUE:  Multidetector CT imaging of the chest was performed using the standard protocol during bolus administration of  intravenous contrast. Multiplanar CT image reconstructions and MIPs were obtained to evaluate the vascular anatomy. CONTRAST:  100mL OMNIPAQUE IOHEXOL 350 MG/ML SOLN COMPARISON:  None. FINDINGS: Cardiovascular: Satisfactory opacification of the pulmonary arteries to the segmental level. No evidence of pulmonary embolism. Normal heart size. No pericardial effusion. Central pulmonary arteries are of normal caliber. Aberrant origin of the right subclavian artery is noted. The thoracic aorta is otherwise unremarkable. 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 No surrounding inflammatory change. No pathologic mediastinal adenopathy. Esophagus unremarkable. Lungs/Pleura: Lungs are clear. No pleural effusion or pneumothorax. Upper Abdomen: No acute abnormality. Musculoskeletal: No chest wall abnormality. No acute or significant osseous findings. Review of the MIP images confirms the above findings. 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 Variant arch anatomy. Otherwise unremarkable examination. No pulmonary embolism. Electronically Signed   By: Helyn NumbersAshesh  Parikh MD   On: 12/23/2019 00:35   CT Abdomen Pelvis W Contrast  Result Date: 12/23/2019 CLINICAL DATA:  Abdominal pain and fever. EXAM: CT ABDOMEN AND PELVIS WITH CONTRAST TECHNIQUE: Multidetector CT imaging of the abdomen and pelvis was performed using the standard protocol following bolus administration of intravenous contrast. CONTRAST:  100mL OMNIPAQUE IOHEXOL 350 MG/ML SOLN COMPARISON:  06/05/2017 FINDINGS: Lower chest: The lung bases are clear. The heart size is normal. Hepatobiliary: There is probable hepatic  steatosis. Normal gallbladder.There is no biliary ductal dilation. Pancreas: Normal contours without ductal dilatation. No peripancreatic fluid collection. Spleen: The spleen is borderline enlarged. Adrenals/Urinary Tract: --Adrenal glands: Unremarkable. --Right kidney/ureter: No hydronephrosis or radiopaque kidney stones. --Left kidney/ureter: There is mild left-sided hydronephrosis secondary to an obstructing 5 mm stone in the proximal left ureter (axial series 6, image 48). No additional stones are noted involving the left kidney. The left kidney is heterogeneously enhancing which raises concern for underlying pyelonephritis. --Urinary bladder: Unremarkable. Stomach/Bowel: --Stomach/Duodenum: No hiatal hernia or other gastric abnormality. Normal duodenal course and caliber. --Small bowel: Unremarkable. --Colon: Unremarkable. --Appendix: Normal. Vascular/Lymphatic: Normal course and caliber of the major abdominal vessels. --No retroperitoneal lymphadenopathy. --No mesenteric lymphadenopathy. --No pelvic or inguinal lymphadenopathy. Reproductive: There is a small left ovarian cyst measuring approximately 3.5 cm. No further follow-up is required. Other: No ascites or free air. The abdominal wall is normal. Musculoskeletal. No acute displaced fractures. IMPRESSION: Mild left-sided hydronephrosis secondary to an obstructing 5 mm stone in the proximal left ureter. The left kidney is heterogeneously enhancing which raises concern for underlying pyelonephritis. There is probable hepatic steatosis. Electronically Signed   By: Katherine Mantlehristopher  Green M.D.   On: 12/23/2019 00:36   DG Chest Port 1 View  Result Date: 12/22/2019 CLINICAL DATA:  Chest pain and fever EXAM: PORTABLE CHEST 1 VIEW COMPARISON:  11/28/2014 FINDINGS: The heart size and mediastinal contours are within normal limits. Both lungs are clear. The visualized skeletal structures are unremarkable. IMPRESSION: No active disease. Electronically Signed   By: Jasmine PangKim   Fujinaga M.D.   On: 12/22/2019 22:20    Impression/Assessment:  Obstructing left ureteral stone with pyelonephritis: CT A/P 12/23/2019 demonstrated mild left hydronephrosis secondary to obstructing 5 mm stone the proximal left ureter. Left kidney is heterogenous enhancing which raise concern for underlying pyelonephritis. Afebrile, WBC 7.1, CR 0.65, UA with negative nitrites, moderate leukocytes, urine culture pending.  -To OR for cystoscopy, left retrograde pyelogram, left ureteral stent placement -Start antibiotics -  Follow-up urine culture -Admit for observation. Will discharge home once on culture specific antibiotics -We will need definitive treatment of ureteral stone as an outpatient. Will message schedulers.  -The risks, benefits and alternatives of cystoscopy with left JJ stent placement was discussed with the patient.  Risks include, but are not limited to: bleeding, urinary tract infection, ureteral injury, ureteral stricture disease, chronic pain, urinary symptoms, bladder injury, stent migration, the need for nephrostomy tube placement, MI, CVA, DVT, PE and the inherent risks with general anesthesia.  The patient voices understanding and wishes to proceed.   Jannifer Hick 12/23/2019, 2:10 AM  Matt R. Arlynn Stare MD [facility]  Pager: [phone]

## 2019-12-24 ENCOUNTER — Inpatient Hospital Stay (HOSPITAL_COMMUNITY): Payer: Self-pay

## 2019-12-24 LAB — CBC
HCT: 29.8 % — ABNORMAL LOW (ref 36.0–46.0)
Hemoglobin: 8.7 g/dL — ABNORMAL LOW (ref 12.0–15.0)
MCH: 23.4 pg — ABNORMAL LOW (ref 26.0–34.0)
MCHC: 29.2 g/dL — ABNORMAL LOW (ref 30.0–36.0)
MCV: 80.1 fL (ref 80.0–100.0)
Platelets: 225 10*3/uL (ref 150–400)
RBC: 3.72 MIL/uL — ABNORMAL LOW (ref 3.87–5.11)
RDW: 18.1 % — ABNORMAL HIGH (ref 11.5–15.5)
WBC: 6.9 10*3/uL (ref 4.0–10.5)
nRBC: 0 % (ref 0.0–0.2)

## 2019-12-24 LAB — BASIC METABOLIC PANEL
Anion gap: 8 (ref 5–15)
BUN: 6 mg/dL (ref 6–20)
CO2: 23 mmol/L (ref 22–32)
Calcium: 11.2 mg/dL — ABNORMAL HIGH (ref 8.9–10.3)
Chloride: 109 mmol/L (ref 98–111)
Creatinine, Ser: 0.69 mg/dL (ref 0.44–1.00)
GFR, Estimated: >60 mL/min (ref >60)
Glucose, Bld: 114 mg/dL — ABNORMAL HIGH (ref 70–99)
Potassium: 4.1 mmol/L (ref 3.5–5.1)
Sodium: 140 mmol/L (ref 135–145)

## 2019-12-24 LAB — URINE CULTURE

## 2019-12-24 MED ORDER — CIPROFLOXACIN HCL 500 MG PO TABS: 500.0000 mg | ORAL_TABLET | Freq: Two times a day (BID) | ORAL | 0 refills | Status: AC

## 2019-12-24 MED ORDER — IBUPROFEN 400 MG PO TABS: 600.0000 mg | ORAL_TABLET | Freq: Four times a day (QID) | ORAL | Status: DC | PRN

## 2019-12-24 MED ORDER — SODIUM CHLORIDE 0.9% FLUSH
3.0000 mL | Freq: Two times a day (BID) | INTRAVENOUS | Status: DC
  Administered 2019-12-24 (×2): 3 mL via INTRAVENOUS

## 2019-12-24 MED ORDER — SODIUM CHLORIDE 0.9 % IV SOLN: 250.0000 mL | INTRAVENOUS | Status: DC | PRN

## 2019-12-24 MED ORDER — SODIUM CHLORIDE 0.9% FLUSH: 3.0000 mL | INTRAVENOUS | Status: DC | PRN

## 2019-12-24 NOTE — Progress Notes (Signed)
Patient refused bed alarm. Falls education done. 

## 2019-12-24 NOTE — Progress Notes (Signed)
Went into patient's room to resume care from previous nurse. I introduced myself to her , she  yelled at me saying my IV is leaking and no one has been here to help me. Prior to this I had assisted her to the bathroom and informed  her the IV dressing was coming off so I wanted to reinforce her dressing , but she declined stating " its wet because I have been washing my hands".

## 2019-12-24 NOTE — Anesthesia Postprocedure Evaluation (Signed)
Anesthesia Post Note  Patient: Virginia Ferguson  Procedure(s) Performed: CYSTOSCOPY WITH LEFT RETROGRADE PYELOGRAM/URETERAL STENT PLACEMENT X2 (Left )     Anesthesia Post Evaluation No complications documented.  Last Vitals:  Vitals:   12/24/19 0433 12/24/19 0913  BP: 115/62 (!) 91/47  Pulse: 74 68  Resp: 16   Temp: 36.9 C   SpO2: 99%     Last Pain:  Vitals:   12/24/19 0807  TempSrc:   PainSc: 8                  Kimoni Pagliarulo

## 2019-12-24 NOTE — Progress Notes (Signed)
Unable to measure patient's urine because she keeps putting toilet paper in her specimen container which soaks up all her urine.

## 2019-12-24 NOTE — Progress Notes (Signed)
2 Days Post-Op Subjective: Pain better controlled, no nausea or emesis.  Objective: Vital signs in last 24 hours: Temp:  [98.2 F (36.8 C)-98.8 F (37.1 C)] 98.5 F (36.9 C) (11/15 0600) Pulse Rate:  [67-82] 70 (11/15 0600) Resp:  [16] 16 (11/15 0600) BP: (91-136)/(47-74) 136/74 (11/15 0600) SpO2:  [94 %-99 %] 94 % (11/15 0600)  Intake/Output from previous day: 11/14 0701 - 11/15 0700 In: 720 [P.O.:720] Out: 600 [Urine:600] Intake/Output this shift: No intake/output data recorded.  Physical Exam:  General: Alert and oriented CV: RRR Lungs: Clear Abdomen: Soft, ND, NT Ext: NT, No erythema  Lab Results: Recent Labs    12/23/19 0700 12/24/19 0409 12/25/19 0052  HGB 9.8* 8.7* 8.1*  HCT 33.4* 29.8* 27.8*   BMET Recent Labs    12/24/19 0409 12/25/19 0052  NA 140 141  K 4.1 3.9  CL 109 110  CO2 23 23  GLUCOSE 114* 95  BUN 6 9  CREATININE 0.69 1.05*  CALCIUM 11.2* 9.6     Studies/Results: DG Abd 1 View  Result Date: 12/24/2019 CLINICAL DATA:  Abdominal pain and nausea EXAM: ABDOMEN - 1 VIEW COMPARISON:  CT abdomen and pelvis December 23, 2019 FINDINGS: There are now double-J stents extending from the upper and mid collecting system regions of the left kidney to the bladder. There is moderate stool in the colon. There is no bowel dilatation or air-fluid level to suggest bowel obstruction. No appreciable free air. 2 mm probable phlebolith right pelvis. Visualized lung bases clear. IMPRESSION: Double-J stents on the left. No abnormal calcification beyond probable 2 mm phlebolith in right pelvis. No bowel obstruction or free air. Electronically Signed   By: Bretta Bang III M.D.   On: 12/24/2019 10:21   US RENAL  Result Date: 12/24/2019 CLINICAL DATA:  Flank pain EXAM: RENAL / URINARY TRACT ULTRASOUND COMPLETE COMPARISON:  CT abdomen and pelvis December 23, 2019 FINDINGS: Right Kidney: Renal measurements: 11.2 x 4.8 x 5.5 cm = volume: 155 mL. Echogenicity and  renal cortical thickness are within normal limits. No mass, perinephric fluid, or hydronephrosis visualized. No sonographically demonstrable calculus or ureterectasis. Left Kidney: Renal measurements: 11.4 x 6.4 x 5.1 cm = volume: 195 mL. Echogenicity and renal cortical thickness are within normal limits. No mass or perinephric fluid, or hydronephrosis visualized. A small portion of an apparent stent is seen in the upper pole kidney region. No sonographically demonstrable calculus or ureterectasis. Bladder: Stent seen in urinary bladder. Urinary bladder is largely decompressed. No lesions seen in bladder. Other: None. 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 No abnormal calcifications evident in either kidney. Kidneys otherwise appear normal bilaterally by ultrasound. Electronically Signed   By: Bretta Bang III M.D.   On: 12/24/2019 10:24    Assessment/Plan: 1. Obstructing left ureteral stone with pyelonephritis:CT A/P 12/23/2019 demonstrated mild left hydronephrosis secondary to obstructing 5 mm stone the proximal left ureter. Left kidney is heterogenous enhancing which raise concern for underlying pyelonephritis. Afebrile, WBC 7.1, CR 0.65, UA with negative nitrites, moderate leukocytes  -UCx >100K citrobacter Koseri, pansensitive. Continue gent while in house. Will transition to cipro as outpatient for 7 days. -Reviewed KUB and RBUS 11/14 with stents in appropriate position, no hydro -Pain control as needed with narcotics, toradol, flomax, ditropan. Benadryl for itch. -Will need definitive management of stone as outpatient after infection resolves. -Discussed that she will have stents in for a few weeks until we can get her scheduled -Discharge home today  LOS: 2 days   Jannifer Hick 12/25/2019, 7:17 AM Matt R. Shalamar Plourde MD [facility]  Pager: [phone]

## 2019-12-24 NOTE — Progress Notes (Signed)
1 Day Post-Op Subjective: C/o worsening left flank pain. States dilaudid controls pain. She does not think that her pain can be controlled with PO meds and is refusing discharge home today. No n/v. Afebrile. No dysuria. Mild hematuria.  Objective: Vital signs in last 24 hours: Temp:  [98.1 F (36.7 C)-98.4 F (36.9 C)] 98.4 F (36.9 C) (11/14 0433) Pulse Rate:  [68-77] 68 (11/14 0913) Resp:  [16] 16 (11/14 0433) BP: (91-133)/(47-79) 91/47 (11/14 0913) SpO2:  [99 %-100 %] 99 % (11/14 0433)  Intake/Output from previous day: 11/13 0701 - 11/14 0700 In: 1003.9 [P.O.:720; I.V.:283.9] Out: 350 [Urine:350] Intake/Output this shift: Total I/O In: 240 [P.O.:240] Out: 150 [Urine:150]   UOP: - poorly recorded  Physical Exam:  General: Alert and oriented CV: RRR Lungs: Clear Abdomen: Soft, ND, mildly tender in left flank Ext: NT, No erythema  Lab Results: Recent Labs    12/22/19 2124 12/23/19 0700 12/24/19 0409  HGB 9.8* 9.8* 8.7*  HCT 33.2* 33.4* 29.8*   BMET Recent Labs    12/23/19 0733 12/24/19 0409  NA 139 140  K 4.6 4.1  CL 109 109  CO2 23 23  GLUCOSE 113* 114*  BUN 5* 6  CREATININE 0.68 0.69  CALCIUM 10.6* 11.2*     Studies/Results: DG Abd 1 View  Result Date: 12/24/2019 CLINICAL DATA:  Abdominal pain and nausea EXAM: ABDOMEN - 1 VIEW COMPARISON:  CT abdomen and pelvis December 23, 2019 FINDINGS: There are now double-J stents extending from the upper and mid collecting system regions of the left kidney to the bladder. There is moderate stool in the colon. There is no bowel dilatation or air-fluid level to suggest bowel obstruction. No appreciable free air. 2 mm probable phlebolith right pelvis. Visualized lung bases clear. IMPRESSION: Double-J stents on the left. No abnormal calcification beyond probable 2 mm phlebolith in right pelvis. No bowel obstruction or free air. Electronically Signed   By: Bretta Bang III M.D.   On: 12/24/2019 10:21   CT  Angio Chest PE W/Cm &/Or Wo Cm  Result Date: 12/23/2019 CLINICAL DATA:  Dyspnea, chest tightness EXAM: CT ANGIOGRAPHY CHEST WITH CONTRAST TECHNIQUE: Multidetector CT imaging of the chest was performed using the standard protocol during bolus administration of intravenous contrast. Multiplanar CT image reconstructions and MIPs were obtained to evaluate the vascular anatomy. CONTRAST:  OMNIPAQUE IOHEXOL 350 MG/ML SOLN COMPARISON:  None. FINDINGS: Cardiovascular: Satisfactory opacification of the pulmonary arteries to the segmental level. No evidence of pulmonary embolism. Normal heart size. No pericardial effusion. Central pulmonary arteries are of normal caliber. Aberrant origin of the right subclavian artery is noted. The thoracic aorta is otherwise unremarkable. 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 No surrounding inflammatory change. No pathologic mediastinal adenopathy. Esophagus unremarkable. Lungs/Pleura: Lungs are clear. No pleural effusion or pneumothorax. Upper Abdomen: No acute abnormality. Musculoskeletal: No chest wall abnormality. No acute or significant osseous findings. Review of the MIP images confirms the above findings. 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 Variant arch anatomy. Otherwise unremarkable examination. No pulmonary embolism. Electronically Signed   By: Helyn Numbers MD   On: 12/23/2019 00:35   CT Abdomen Pelvis W Contrast  Result Date: 12/23/2019 CLINICAL DATA:  Abdominal pain and fever. EXAM: CT ABDOMEN AND PELVIS WITH CONTRAST TECHNIQUE: Multidetector CT imaging  of the abdomen and pelvis was performed using the standard protocol following bolus administration of intravenous contrast. CONTRAST:  OMNIPAQUE IOHEXOL 350 MG/ML  SOLN COMPARISON:  06/05/2017 FINDINGS: Lower chest: The lung bases are clear. The heart size is normal. Hepatobiliary: There is probable hepatic steatosis. Normal gallbladder.There is no biliary ductal dilation. Pancreas: Normal contours without ductal dilatation. No peripancreatic fluid collection. Spleen: The spleen is borderline enlarged. Adrenals/Urinary Tract: --Adrenal glands: Unremarkable. --Right kidney/ureter: No hydronephrosis or radiopaque kidney stones. --Left kidney/ureter: There is mild left-sided hydronephrosis secondary to an obstructing 5 mm stone in the proximal left ureter (axial series 6, image 48). No additional stones are noted involving the left kidney. The left kidney is heterogeneously enhancing which raises concern for underlying pyelonephritis. --Urinary bladder: Unremarkable. Stomach/Bowel: --Stomach/Duodenum: No hiatal hernia or other gastric abnormality. Normal duodenal course and caliber. --Small bowel: Unremarkable. --Colon: Unremarkable. --Appendix: Normal. Vascular/Lymphatic: Normal course and caliber of the major abdominal vessels. --No retroperitoneal lymphadenopathy. --No mesenteric lymphadenopathy. --No pelvic or inguinal lymphadenopathy. Reproductive: There is a small left ovarian cyst measuring approximately 3.5 cm. No further follow-up is required. Other: No ascites or free air. The abdominal wall is normal. Musculoskeletal. No acute displaced fractures. IMPRESSION: Mild left-sided hydronephrosis secondary to an obstructing 5 mm stone in the proximal left ureter. The left kidney is heterogeneously enhancing which raises concern for underlying pyelonephritis. There is probable hepatic steatosis. Electronically Signed   By: Katherine Mantle M.D.   On: 12/23/2019 00:36   US RENAL  Result Date: 12/24/2019 CLINICAL DATA:  Flank pain EXAM: RENAL / URINARY TRACT ULTRASOUND COMPLETE COMPARISON:  CT abdomen and pelvis December 23, 2019 FINDINGS: Right Kidney: Renal  measurements: 11.2 x 4.8 x 5.5 cm = volume: 155 mL. Echogenicity and renal cortical thickness are within normal limits. No mass, perinephric fluid, or hydronephrosis visualized. No sonographically demonstrable calculus or ureterectasis. Left Kidney: Renal measurements: 11.4 x 6.4 x 5.1 cm = volume: 195 mL. Echogenicity and renal cortical thickness are within normal limits. No mass or perinephric fluid, or hydronephrosis visualized. A small portion of an apparent stent is seen in the upper pole kidney region. No sonographically demonstrable calculus or ureterectasis. Bladder: Stent seen in urinary bladder. Urinary bladder is largely decompressed. No lesions seen in bladder. Other: None. 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 No abnormal calcifications evident in either kidney. Kidneys otherwise appear normal bilaterally by ultrasound. Electronically Signed   By: Bretta Bang III M.D.   On: 12/24/2019 10:24   DG Chest Port 1 View  Result Date: 12/22/2019 CLINICAL DATA:  Chest pain and fever EXAM: PORTABLE CHEST 1 VIEW COMPARISON:  11/28/2014 FINDINGS: The heart size and mediastinal contours are within normal limits. Both lungs are clear. The visualized skeletal structures are unremarkable. IMPRESSION: No active disease. Electronically Signed   By: Jasmine Pang M.D.   On: 12/22/2019 22:20   DG C-Arm 1-60 Min-No Report  Result Date: 12/23/2019 Fluoroscopy was utilized by the requesting physician.  No radiographic interpretation.    Assessment/Plan: 1. Obstructing left ureteral stone with pyelonephritis: CT A/P 12/23/2019 demonstrated mild left hydronephrosis secondary to obstructing 5 mm stone the proximal left ureter. Left kidney is heterogenous enhancing which raise concern for underlying pyelonephritis. Afebrile, WBC 7.1, CR 0.65, UA with negative nitrites, moderate leukocytes  -UCx >100K citrobacter Koseri, pansensitive. Continue gent while in house. Will  transition to cipro as outpatient for 7 days. -Reviewed KUB and RBUS with stents in appropriate position, no hydro -Pain  control as needed with narcotics, toradol, flomax, ditropan. Benadryl for itch. -Will need definitive management of stone as outpatient after infection resolves. -Discussed that she will have stents in for a few weeks until we can get her scheduled -Plan for discharge home tomorrow   LOS: 1 day   Jannifer Hick 12/24/2019, 10:47 AM Matt R. Eugene Isadore MD [facility]  Pager: [phone]

## 2019-12-25 ENCOUNTER — Encounter (HOSPITAL_COMMUNITY): Payer: Self-pay | Admitting: Urology

## 2019-12-25 LAB — BASIC METABOLIC PANEL
Anion gap: 8 (ref 5–15)
BUN: 9 mg/dL (ref 6–20)
CO2: 23 mmol/L (ref 22–32)
Calcium: 9.6 mg/dL (ref 8.9–10.3)
Chloride: 110 mmol/L (ref 98–111)
Creatinine, Ser: 1.05 mg/dL — ABNORMAL HIGH (ref 0.44–1.00)
GFR, Estimated: >60 mL/min (ref >60)
Glucose, Bld: 95 mg/dL (ref 70–99)
Potassium: 3.9 mmol/L (ref 3.5–5.1)
Sodium: 141 mmol/L (ref 135–145)

## 2019-12-25 LAB — CBC
HCT: 27.8 % — ABNORMAL LOW (ref 36.0–46.0)
Hemoglobin: 8.1 g/dL — ABNORMAL LOW (ref 12.0–15.0)
MCH: 23.2 pg — ABNORMAL LOW (ref 26.0–34.0)
MCHC: 29.1 g/dL — ABNORMAL LOW (ref 30.0–36.0)
MCV: 79.7 fL — ABNORMAL LOW (ref 80.0–100.0)
Platelets: 254 10*3/uL (ref 150–400)
RBC: 3.49 MIL/uL — ABNORMAL LOW (ref 3.87–5.11)
RDW: 18.2 % — ABNORMAL HIGH (ref 11.5–15.5)
WBC: 5.9 10*3/uL (ref 4.0–10.5)
nRBC: 0 % (ref 0.0–0.2)

## 2019-12-25 NOTE — Progress Notes (Signed)
Virginia Ferguson to be D/C'd Home per MD order.  Discussed with the patient and all questions fully answered.   VSS, Skin clean, dry and intact without evidence of skin break down, no evidence of skin tears noted. IV catheter discontinued intact. Site without signs and symptoms of complications. Dressing and pressure applied.   An After Visit Summary was printed and given to the patient.    D/C education completed with patient/family including follow up instructions, medication list, d/c activities limitations if indicated, with other d/c instructions as indicated by MD - patient able to verbalize understanding, all questions fully answered.    Patient instructed to return to ED, call 911, or call MD for any changes in condition.    Patient escorted via WC, and D/C home via car.

## 2019-12-25 NOTE — Discharge Summary (Signed)
Date of admission: 12/22/2019  Date of discharge: 12/25/2019  Admission diagnosis: Left ureteral stone  Discharge diagnosis: Same  Secondary diagnoses: Left flank pain  History and Physical: For full details, please see admission history and physical. Briefly, Virginia Ferguson is a 39 y.o. year old patient with left ureteral obstructing stone and signs of infection.   Hospital Course: The patient underwent cysto, L RPG, left stent placement x2 and recovered in the usual expected fashion.  She had her diet advanced slowly.  Initially managed with IV pain control, then transitioned to PO meds when she was tolerating oral intake.  Her labs were stable throughout the hospital course.  She was discharged to home on POD#2.  At the time of discharge she was tolerating a regular diet, passing flatus, ambulating, had adequate pain control and was agreeable to discharge.  Follow up as scheduled.    Laboratory values: Recent Labs    12/23/19 0700 12/24/19 0409 12/25/19 0052  HGB 9.8* 8.7* 8.1*  HCT 33.4* 29.8* 27.8*   Recent Labs    12/24/19 0409 12/25/19 0052  CREATININE 0.69 1.05*    Disposition: Home  Discharge instruction: The patient was instructed to be ambulatory but told to refrain from heavy lifting, strenuous activity, or driving.   Discharge medications:  Allergies as of 12/25/2019   No Known Allergies     Medication List    TAKE these medications   ciprofloxacin 500 MG tablet Commonly known as: Cipro Take 1 tablet (500 mg total) by mouth 2 (two) times daily for 7 days.   docusate sodium 100 MG capsule Commonly known as: Colace Take 1 capsule (100 mg total) by mouth daily as needed for up to 30 doses.   ibuprofen 600 MG tablet Commonly known as: ADVIL Take 1 tablet (600 mg total) by mouth every 6 (six) hours as needed.   oxybutynin 10 MG 24 hr tablet Commonly known as: DITROPAN-XL Take 1 tablet (10 mg total) by mouth at bedtime.   oxyCODONE-acetaminophen 5-325  MG tablet Commonly known as: Percocet Take 1 tablet by mouth every 4 (four) hours as needed for up to 10 doses for severe pain.   tamsulosin 0.4 MG Caps capsule Commonly known as: FLOMAX Take 1 capsule (0.4 mg total) by mouth daily.       Followup:   Follow-up Information    [facility]. Schedule an appointment as soon as possible for a visit in 2 weeks.   Contact information: [address] [place] [address] [phone]              Matt R. Fransisco Messmer MD [facility]  Pager: [phone]

## 2019-12-25 NOTE — Plan of Care (Signed)
Pt understanding of discharge instructions  

## 2019-12-28 LAB — ANAEROBIC CULTURE

## 2020-01-08 ENCOUNTER — Other Ambulatory Visit: Payer: Self-pay | Admitting: Urology

## 2020-01-09 ENCOUNTER — Other Ambulatory Visit: Payer: Self-pay

## 2020-01-09 ENCOUNTER — Encounter (HOSPITAL_COMMUNITY): Payer: Self-pay | Admitting: Urology

## 2020-01-09 NOTE — Progress Notes (Signed)
COVID Vaccine Completed: N/A Date COVID Vaccine completed: N/A COVID vaccine manufacturer: N/A  PCP -Leilani Able, MD  Cardiologist - N/A  Chest x-ray - 12/22/19 in epic EKG - 12/22/19 in epic Stress Test - N/A ECHO - N/A Cardiac Cath - N/A Pacemaker/ICD device last checked: N/A  Sleep Study - N/A CPAP - N/A  Fasting Blood Sugar - N/A Checks Blood Sugar __N/A___ times a day  Blood Thinner Instructions: N/A Aspirin Instructions: N/A Last Dose: N/A  Activity level: Can go up a flight of stairs without stopping and without symptoms  Anesthesia review: N/A  Patient denies shortness of breath, fever, cough and chest pain at PAT appointment   Patient verbalized understanding of instructions that were given to them at the PAT appointment. Patient was also instructed that they will need to review over the PAT instructions again at home before surgery.

## 2020-01-11 ENCOUNTER — Other Ambulatory Visit (HOSPITAL_COMMUNITY)
Admission: RE | Admit: 2020-01-11 | Discharge: 2020-01-11 | Disposition: A | Discharge status: Home / Self Care | Payer: Medicaid Other | Source: Ambulatory Visit | Attending: Urology | Admitting: Urology

## 2020-01-11 DIAGNOSIS — Z20822 Contact with and (suspected) exposure to covid-19: Secondary | ICD-10-CM | POA: Insufficient documentation

## 2020-01-11 DIAGNOSIS — Z01812 Encounter for preprocedural laboratory examination: Secondary | ICD-10-CM | POA: Diagnosis not present

## 2020-01-11 LAB — SARS CORONAVIRUS 2 (TAT 6-24 HRS): SARS Coronavirus 2: NEGATIVE

## 2020-01-12 NOTE — Anesthesia Preprocedure Evaluation (Addendum)
Anesthesia Evaluation  Patient identified by MRN, date of birth, ID band Patient awake    Reviewed: Allergy & Precautions, NPO status , Patient's Chart, lab work & pertinent test results  Airway Mallampati: II  TM Distance: >3 FB Neck ROM: Full    Dental no notable dental hx. (+) Teeth Intact, Dental Advisory Given   Pulmonary neg pulmonary ROS, Current SmokerPatient did not abstain from smoking.,    Pulmonary exam normal breath sounds clear to auscultation       Cardiovascular negative cardio ROS Normal cardiovascular exam Rhythm:Regular Rate:Normal     Neuro/Psych negative neurological ROS  negative psych ROS   GI/Hepatic negative GI ROS, Neg liver ROS,   Endo/Other  Morbid obesity (BMI 47)  Renal/GU negative Renal ROS  negative genitourinary   Musculoskeletal negative musculoskeletal ROS (+)   Abdominal   Peds  Hematology  (+) Blood dyscrasia (Hgb 8.1), anemia ,   Anesthesia Other Findings   Reproductive/Obstetrics                           Anesthesia Physical Anesthesia Plan  ASA: III  Anesthesia Plan: General   Post-op Pain Management:    Induction: Intravenous  PONV Risk Score and Plan: Ondansetron, Dexamethasone and Midazolam  Airway Management Planned: LMA  Additional Equipment:   Intra-op Plan:   Post-operative Plan: Extubation in OR  Informed Consent: I have reviewed the patients History and Physical, chart, labs and discussed the procedure including the risks, benefits and alternatives for the proposed anesthesia with the patient or authorized representative who has indicated his/her understanding and acceptance.     Dental advisory given  Plan Discussed with: CRNA  Anesthesia Plan Comments:         Anesthesia Quick Evaluation

## 2020-01-12 NOTE — Progress Notes (Signed)
Spoke with patient by phone patient aware surgery location changed to [facility] Arrive 915 am and may take oxycodone prn am of surgery, and tamsulosin and mybetriq and follow all other pre op instructions given at pre op phone call by tamika hwester rn

## 2020-01-14 MED ORDER — DEXTROSE 5 % IV SOLN
3.0000 g | Freq: Once | INTRAVENOUS | Status: DC
  Filled 2020-01-14: qty 3000

## 2020-01-15 ENCOUNTER — Encounter (HOSPITAL_BASED_OUTPATIENT_CLINIC_OR_DEPARTMENT_OTHER)
Admission: RE | Disposition: A | Discharge status: Home / Self Care | Payer: Self-pay | Source: Home / Self Care | Attending: Urology

## 2020-01-15 ENCOUNTER — Ambulatory Visit (HOSPITAL_BASED_OUTPATIENT_CLINIC_OR_DEPARTMENT_OTHER): Payer: Self-pay | Admitting: Physician Assistant

## 2020-01-15 ENCOUNTER — Other Ambulatory Visit: Payer: Self-pay

## 2020-01-15 ENCOUNTER — Encounter (HOSPITAL_BASED_OUTPATIENT_CLINIC_OR_DEPARTMENT_OTHER): Payer: Self-pay | Admitting: Urology

## 2020-01-15 ENCOUNTER — Ambulatory Visit (HOSPITAL_BASED_OUTPATIENT_CLINIC_OR_DEPARTMENT_OTHER)
Admission: RE | Admit: 2020-01-15 | Discharge: 2020-01-15 | Disposition: A | Discharge status: Home / Self Care | Payer: Self-pay | Attending: Urology | Admitting: Urology

## 2020-01-15 DIAGNOSIS — F17200 Nicotine dependence, unspecified, uncomplicated: Secondary | ICD-10-CM | POA: Insufficient documentation

## 2020-01-15 DIAGNOSIS — Q625 Duplication of ureter: Secondary | ICD-10-CM | POA: Insufficient documentation

## 2020-01-15 DIAGNOSIS — N201 Calculus of ureter: Secondary | ICD-10-CM | POA: Insufficient documentation

## 2020-01-15 HISTORY — DX: Iron deficiency anemia, unspecified: D50.9

## 2020-01-15 HISTORY — PX: CYSTOSCOPY/URETEROSCOPY/HOLMIUM LASER/STENT PLACEMENT: SHX6546

## 2020-01-15 HISTORY — DX: Personal history of urinary calculi: Z87.442

## 2020-01-15 LAB — POCT I-STAT, CHEM 8
BUN: 11 mg/dL (ref 6–20)
BUN: 11 mg/dL (ref 6–20)
Calcium, Ion: 1.51 mmol/L (ref 1.15–1.40)
Calcium, Ion: 1.54 mmol/L (ref 1.15–1.40)
Chloride: 108 mmol/L (ref 98–111)
Chloride: 108 mmol/L (ref 98–111)
Creatinine, Ser: 0.6 mg/dL (ref 0.44–1.00)
Creatinine, Ser: 0.6 mg/dL (ref 0.44–1.00)
Glucose, Bld: 85 mg/dL (ref 70–99)
Glucose, Bld: 85 mg/dL (ref 70–99)
HCT: 34 % — ABNORMAL LOW (ref 36.0–46.0)
HCT: 35 % — ABNORMAL LOW (ref 36.0–46.0)
Hemoglobin: 11.6 g/dL — ABNORMAL LOW (ref 12.0–15.0)
Hemoglobin: 11.9 g/dL — ABNORMAL LOW (ref 12.0–15.0)
Potassium: 4.5 mmol/L (ref 3.5–5.1)
Potassium: 4.5 mmol/L (ref 3.5–5.1)
Sodium: 139 mmol/L (ref 135–145)
Sodium: 140 mmol/L (ref 135–145)
TCO2: 23 mmol/L (ref 22–32)
TCO2: 25 mmol/L (ref 22–32)

## 2020-01-15 LAB — POCT PREGNANCY, URINE: Preg Test, Ur: NEGATIVE

## 2020-01-15 SURGERY — CYSTOSCOPY/URETEROSCOPY/HOLMIUM LASER/STENT PLACEMENT
Anesthesia: General | Site: Ureter | Laterality: Left

## 2020-01-15 MED ORDER — OXYCODONE HCL 5 MG PO TABS
ORAL_TABLET | ORAL | Status: AC
  Filled 2020-01-15: qty 1

## 2020-01-15 MED ORDER — PROPOFOL 10 MG/ML IV BOLUS
INTRAVENOUS | Status: AC
  Filled 2020-01-15: qty 20

## 2020-01-15 MED ORDER — FENTANYL CITRATE (PF) 100 MCG/2ML IJ SOLN
25.0000 ug | INTRAMUSCULAR | Status: DC | PRN
  Administered 2020-01-15 (×2): 50 ug via INTRAVENOUS

## 2020-01-15 MED ORDER — CEFAZOLIN SODIUM-DEXTROSE 2-4 GM/100ML-% IV SOLN
INTRAVENOUS | Status: AC
  Filled 2020-01-15: qty 100

## 2020-01-15 MED ORDER — ONDANSETRON HCL 4 MG/2ML IJ SOLN
INTRAMUSCULAR | Status: DC | PRN
  Administered 2020-01-15: 4 mg via INTRAVENOUS

## 2020-01-15 MED ORDER — SODIUM CHLORIDE 0.9 % IR SOLN
Status: DC | PRN
  Administered 2020-01-15: 3000 mL via INTRAVESICAL

## 2020-01-15 MED ORDER — MIDAZOLAM HCL 5 MG/5ML IJ SOLN
INTRAMUSCULAR | Status: DC | PRN
  Administered 2020-01-15: 2 mg via INTRAVENOUS

## 2020-01-15 MED ORDER — GENTAMICIN SULFATE 40 MG/ML IJ SOLN: 5.0000 mg/kg | Freq: Once | INTRAVENOUS | Status: DC

## 2020-01-15 MED ORDER — GENTAMICIN SULFATE 40 MG/ML IJ SOLN
5.0000 mg/kg | Freq: Once | INTRAVENOUS | Status: AC
  Administered 2020-01-15: 420 mg via INTRAVENOUS
  Filled 2020-01-15: qty 10.5

## 2020-01-15 MED ORDER — IOHEXOL 300 MG/ML  SOLN
INTRAMUSCULAR | Status: DC | PRN
  Administered 2020-01-15: 5 mL via URETHRAL

## 2020-01-15 MED ORDER — FENTANYL CITRATE (PF) 100 MCG/2ML IJ SOLN
INTRAMUSCULAR | Status: DC | PRN
  Administered 2020-01-15 (×2): 25 ug via INTRAVENOUS
  Administered 2020-01-15: 50 ug via INTRAVENOUS
  Administered 2020-01-15 (×2): 25 ug via INTRAVENOUS
  Administered 2020-01-15: 50 ug via INTRAVENOUS

## 2020-01-15 MED ORDER — DEXAMETHASONE SODIUM PHOSPHATE 10 MG/ML IJ SOLN
INTRAMUSCULAR | Status: DC | PRN
  Administered 2020-01-15: 10 mg via INTRAVENOUS

## 2020-01-15 MED ORDER — FENTANYL CITRATE (PF) 100 MCG/2ML IJ SOLN
INTRAMUSCULAR | Status: AC
  Filled 2020-01-15: qty 2

## 2020-01-15 MED ORDER — ONDANSETRON HCL 4 MG/2ML IJ SOLN
INTRAMUSCULAR | Status: AC
  Filled 2020-01-15: qty 2

## 2020-01-15 MED ORDER — PROPOFOL 10 MG/ML IV BOLUS
INTRAVENOUS | Status: DC | PRN
  Administered 2020-01-15: 200 mg via INTRAVENOUS

## 2020-01-15 MED ORDER — MIDAZOLAM HCL 2 MG/2ML IJ SOLN
INTRAMUSCULAR | Status: AC
  Filled 2020-01-15: qty 2

## 2020-01-15 MED ORDER — DEXAMETHASONE SODIUM PHOSPHATE 10 MG/ML IJ SOLN
INTRAMUSCULAR | Status: AC
  Filled 2020-01-15: qty 1

## 2020-01-15 MED ORDER — LACTATED RINGERS IV SOLN: INTRAVENOUS | Status: DC | PRN

## 2020-01-15 MED ORDER — OXYCODONE-ACETAMINOPHEN 5-325 MG PO TABS
1.0000 | ORAL_TABLET | ORAL | 0 refills | Status: DC | PRN
Start: 2020-01-15 — End: 2020-09-24

## 2020-01-15 MED ORDER — CIPROFLOXACIN HCL 500 MG PO TABS: 500.0000 mg | ORAL_TABLET | Freq: Two times a day (BID) | ORAL | 0 refills | Status: AC

## 2020-01-15 MED ORDER — DOCUSATE SODIUM 100 MG PO CAPS: 100.0000 mg | ORAL_CAPSULE | Freq: Every day | ORAL | 0 refills | Status: DC | PRN

## 2020-01-15 MED ORDER — LIDOCAINE HCL (PF) 2 % IJ SOLN
INTRAMUSCULAR | Status: AC
  Filled 2020-01-15: qty 5

## 2020-01-15 MED ORDER — OXYCODONE HCL 5 MG PO TABS
5.0000 mg | ORAL_TABLET | ORAL | Status: DC | PRN
  Administered 2020-01-15: 5 mg via ORAL

## 2020-01-15 MED ORDER — LIDOCAINE 2% (20 MG/ML) 5 ML SYRINGE
INTRAMUSCULAR | Status: DC | PRN
  Administered 2020-01-15: 100 mg via INTRAVENOUS

## 2020-01-15 SURGICAL SUPPLY — 30 items
APL SKNCLS STERI-STRIP NONHPOA (GAUZE/BANDAGES/DRESSINGS) ×1
BAG DRAIN URO-CYSTO SKYTR STRL (DRAIN) ×3 IMPLANT
BAG DRN UROCATH (DRAIN) ×1
BASKET ZERO TIP NITINOL 2.4FR (BASKET) ×2 IMPLANT
BENZOIN TINCTURE PRP APPL 2/3 (GAUZE/BANDAGES/DRESSINGS) ×2 IMPLANT
BSKT STON RTRVL ZERO TP 2.4FR (BASKET) ×1
CATH INTERMIT  6FR 70CM (CATHETERS) IMPLANT
CATH SET URETHRAL DILATOR (CATHETERS) IMPLANT
CATH URET 5FR 28IN OPEN ENDED (CATHETERS) ×3 IMPLANT
CLOTH BEACON ORANGE TIMEOUT ST (SAFETY) ×3 IMPLANT
DRSG TEGADERM 2-3/8X2-3/4 SM (GAUZE/BANDAGES/DRESSINGS) ×2 IMPLANT
FIBER LASER FLEXIVA 365 (UROLOGICAL SUPPLIES) IMPLANT
GLOVE BIO SURGEON STRL SZ7.5 (GLOVE) ×3 IMPLANT
GOWN STRL REUS W/TWL LRG LVL3 (GOWN DISPOSABLE) ×3 IMPLANT
GUIDEWIRE STR DUAL SENSOR (WIRE) ×5 IMPLANT
GUIDEWIRE ZIPWRE .038 STRAIGHT (WIRE) IMPLANT
IV NS 1000ML (IV SOLUTION) ×3
IV NS 1000ML BAXH (IV SOLUTION) ×1 IMPLANT
IV NS IRRIG 3000ML ARTHROMATIC (IV SOLUTION) ×3 IMPLANT
KIT TURNOVER CYSTO (KITS) ×3 IMPLANT
MANIFOLD NEPTUNE II (INSTRUMENTS) ×3 IMPLANT
NS IRRIG 500ML POUR BTL (IV SOLUTION) ×3 IMPLANT
PACK CYSTO (CUSTOM PROCEDURE TRAY) ×3 IMPLANT
SYR 10ML LL (SYRINGE) ×3 IMPLANT
TRACTIP FLEXIVA PULS ID 200XHI (Laser) IMPLANT
TRACTIP FLEXIVA PULSE ID 200 (Laser) ×3
TUBE CONNECTING 12'X1/4 (SUCTIONS) ×1
TUBE CONNECTING 12X1/4 (SUCTIONS) ×2 IMPLANT
TUBE FEEDING 8FR 16IN STR KANG (MISCELLANEOUS) IMPLANT
TUBING UROLOGY SET (TUBING) ×3 IMPLANT

## 2020-01-15 NOTE — Anesthesia Postprocedure Evaluation (Signed)
Anesthesia Post Note  Patient: Virginia Ferguson  Procedure(s) Performed: CYSTOSCOPY/RETROGRADE/URETEROSCOPY/HOLMIUM LASER/STENT PLACEMENT (Left Ureter)     Patient location during evaluation: PACU Anesthesia Type: General Level of consciousness: awake and alert Pain management: pain level controlled Vital Signs Assessment: post-procedure vital signs reviewed and stable Respiratory status: spontaneous breathing, nonlabored ventilation, respiratory function stable and patient connected to nasal cannula oxygen Cardiovascular status: blood pressure returned to baseline and stable Postop Assessment: no apparent nausea or vomiting Anesthetic complications: no   No complications documented.  Last Vitals:  Vitals:   01/15/20 1336 01/15/20 1424  BP:  118/77  Pulse: 71 73  Resp: (!) 24 18  Temp:  36.9 C  SpO2: 98% 99%    Last Pain:  Vitals:   01/15/20 1300  TempSrc:   PainSc: 8                  Dontrez Pettis L Fardeen Steinberger

## 2020-01-15 NOTE — H&P (Signed)
Virginia Ferguson  MRN: [id number]  DOB: [birth year], 39 year old Female  SSN:    PRIMARY CARE:    REFERRING:    PROVIDER:  Jettie PaganMatthew Salahuddin Arismendez, M.D.  TREATING:  Bartholomew CrewsJennaya Davis, NP  LOCATION:  [facility] [phone]     --------------------------------------------------------------------------------   CC/HPI: CC: Left obstructing 5mm proximal stone with associated pyelonephritis   HPI: 39 year old female who was hospitalized on 12/23/2019 due to a 5 mm proximal ureteral stone causing obstruction with associated pyelonephritis. She underwent emergent stent placement and received 2 stents 1 within the upper and lower left moiety, this was completed by Dr. Cardell PeachGay. She had an uneventful hospital course and was discharged on 12/25/2019 tolerating p.o. fluids and p.o. antibiotics. Urine culture grew multiple species. Today she presents to discuss definitive stone treatment. She has been tolerating the stents fairly well, although she does endorses some bladder pain and pressure and gross hematuria. SHe has run out of her pain medication and does not feel the oxybutynin is helping. She denies nausea and vomiting. She denies fevers. She is very tearful today and attributes this to some of her left-sided flank discomfort. She reports that it comes and goes. She has not seen a stone pass.     ALLERGIES: None   MEDICATIONS: Excedrin Migraine PRN     GU PSH: Cystoscopy Insert Stent, Left - 12/23/2019 Cystoscopy Ureteroscopy, Left - 12/23/2019     NON-GU PSH: None   GU PMH: None   NON-GU PMH: None   FAMILY HISTORY: Cancer - Grandmother Death of family member - Father Diabetes - Runs in Family Hematuria - Grandmother Hypertension - Runs in Family    Notes: 1 daughter   SOCIAL HISTORY: Marital Status: Single Preferred Language: English; Ethnicity: Not Hispanic Or Latino; Race: Black or African American Current Smoking Status: Patient smokes. Has smoked since 12/11/2014.   Tobacco Use  Assessment Completed: Used Tobacco in last 30 days? Does not use smokeless tobacco. Social Drinker.  Does not drink caffeine. 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    REVIEW OF SYSTEMS:    GU Review Female:   Patient reports frequent urination, hard to postpone urination, burning /pain with urination, get up at night to urinate, leakage of urine, stream starts and stops, trouble starting your stream, and have to strain to urinate. Patient denies being pregnant.  Gastrointestinal (Upper):   Patient reports nausea. Patient denies vomiting and indigestion/ heartburn.  Gastrointestinal (Lower):   Patient reports constipation. Patient denies diarrhea.  Constitutional:   Patient denies fever, night sweats, weight loss, and fatigue.  Skin:   Patient denies skin rash/ lesion and itching.  Eyes:   Patient denies blurred vision and double vision.  Ears/ Nose/ Throat:   Patient denies sore throat and sinus problems.  Hematologic/Lymphatic:   Patient denies swollen glands and easy bruising.  Cardiovascular:   Patient denies leg swelling and chest pains.  Respiratory:   Patient denies cough and shortness of breath.  Endocrine:   Patient denies excessive thirst.  Musculoskeletal:   Patient reports back pain. Patient denies joint pain.  Neurological:   Patient reports headaches. Patient denies dizziness.  Psychologic:   Patient denies depression and anxiety.   VITAL SIGNS:      01/08/2020 08:55 AM  Weight 289 lb / 131.09 kg  Height 64.5 in / 163.83 cm  BP 117/81 mmHg  Pulse 86 /min  Temperature 96.9 F / 36.0 C  BMI 48.8 kg/m   MULTI-SYSTEM PHYSICAL EXAMINATION:  Constitutional: Well-nourished. No physical deformities. Normally developed. Good grooming.  Respiratory: No labored breathing, no use of accessory muscles.   Cardiovascular: Normal temperature, normal extremity pulses, no swelling, no varicosities.  Skin: No paleness, no jaundice, no cyanosis. No lesion, no ulcer, no rash.   Neurologic / Psychiatric: Tearful.   Gastrointestinal: No mass, no tenderness, no rigidity, non obese abdomen.  Musculoskeletal: Normal gait and station of head and neck.     Complexity of Data:  Source Of History:  Patient, Medical Record Summary  Records Review:   Previous Doctor Records, Previous Hospital Records, Previous Patient Records  Urine Test Review:   Urinalysis  X-Ray Review: C.T. Abdomen/Pelvis: Reviewed Films. Reviewed Report.     01/08/20  Urinalysis  Urine Appearance Cloudy   Urine Color Red   Urine Glucose Neg mg/dL  Urine Bilirubin Neg mg/dL  Urine Ketones Neg mg/dL  Urine Specific Gravity 1.020   Urine Blood 3+ ery/uL  Urine pH 6.5   Urine Protein 2+ mg/dL  Urine Urobilinogen 0.2 mg/dL  Urine Nitrites Neg   Urine Leukocyte Esterase 3+ leu/uL  Urine WBC/hpf 10 - 20/hpf   Urine RBC/hpf >60/hpf   Urine Epithelial Cells 0 - 5/hpf   Urine Bacteria Mod (26-50/hpf)   Urine Mucous Not Present   Urine Yeast NS (Not Seen)   Urine Trichomonas Not Present   Urine Cystals NS (Not Seen)   Urine Casts NS (Not Seen)   Urine Sperm Not Present    PROCEDURES:          Urinalysis w/Scope Dipstick Dipstick Cont'd Micro  Color: Red Bilirubin: Neg mg/dL WBC/hpf: 10 - 85/FYT  Appearance: Cloudy Ketones: Neg mg/dL RBC/hpf: >24/MQK  Specific Gravity: 1.020 Blood: 3+ ery/uL Bacteria: Mod (26-50/hpf)  pH: 6.5 Protein: 2+ mg/dL Cystals: NS (Not Seen)  Glucose: Neg mg/dL Urobilinogen: 0.2 mg/dL Casts: NS (Not Seen)    Nitrites: Neg Trichomonas: Not Present    Leukocyte Esterase: 3+ leu/uL Mucous: Not Present      Epithelial Cells: 0 - 5/hpf      Yeast: NS (Not Seen)      Sperm: Not Present    Notes: Unspun micro due to color and quantity          Ketoralac 60mg  - [id number], The area was prepped and cleaned using sterile technique. A band aid was applied. The pt tolerated well.   Qty: 60 Adm. By: [id number]  Unit: mg Lot No Julien Nordmann  Route: IM Exp. Date  02/08/2021  Freq: None Mfgr.:   Site: Right Buttock   ASSESSMENT:      ICD-10 Details  1 GU:   Ureteral calculus - N20.1 Left, Acute, Systemic Symptoms   PLAN:            Medications New Meds: Ketorolac Tromethamine 10 mg tablet 1 tablet PO Q 6 H PRN   #20  0 Refill(s)  Myrbetriq 50 mg tablet, extended release 24 hr 1 tablet PO Daily   #30  0 Refill(s)  Tamsulosin Hcl 0.4 mg capsule 1 capsule PO Q HS   #30  0 Refill(s)  Ondansetron Hcl 4 mg tablet 1 tablet PO Q 6 H PRN nausea  #30  1 Refill(s)  Oxycodone Hcl 5 mg capsule 1 capsule PO Q 4 H PRN take as needed for severe pain.  #30  0 Refill(s)            Orders Labs CULTURE, URINE          Schedule  Return Visit/Planned Activity: Next Available Appointment - Schedule Surgery  Procedure: 01/08/2020 at [facility] - [phone] - Ketoralac 60mg  (Toradol Per 15 Mg) - , [phone]          Document Letter(s):  Created for Patient: Clinical Summary         Notes:   Urinalysis with moderate bacteriuria will send for precautionary culture. I may continue her on a low-dose of antibiotics until her surgery of culture comes back positive. We discussed in detail ureteroscopy and what to expect with this procedure. I also discussed risks of the procedure with her. I advised an IM injection of Toradol today for discomfort and flank pain, then she will begin oral toradol 4 times per day as needed. oxycodone refilled for severe pain and advised she use this only if needed. Discontinued OXybutynin and she was given samples of 50 mg of myrbetriq to begin for bladder spasms. Continue tamsulosin for ureteral stent discomfort. Advised strict return to clinic precautions for worsening or new symptomatology including fevers, chills, nausea, vomiting or wrosening pain. I also advised increased water intake of 80 ounces and explained this will help alleviate stent pain as well as hematuria. Surgery will be schedules for a left URS.      Signed by [id number], NP on 01/08/20 at 10:10 AM (EST)  Urology Preoperative H&P   Chief Complaint: left ureteral stone  History of Present Illness: JEILY GUTHRIDGE is a 39 y.o. female with 62mm proximal left ureteral stone here for definitive treatment of stone. S/p left stent placement on 11/13. Ucx resulted mixed growth and she received a course of abx.    Past Medical History:  Diagnosis Date  . History of kidney stones   . Iron deficiency anemia   . Medical history non-contributory     Past Surgical History:  Procedure Laterality Date  . CYSTOSCOPY W/ URETERAL STENT PLACEMENT Left 12/23/2019   Procedure: CYSTOSCOPY WITH LEFT RETROGRADE PYELOGRAM/URETERAL STENT PLACEMENT X2;  Surgeon: 12/25/2019, MD;  Location: [facility] OR;  Service: Urology;  Laterality: Left;  . NO PAST SURGERIES      Allergies: No Known Allergies  Family History  Problem Relation Age of Onset  . Hypertension Mother   . Diabetes Mother     Social History:  reports that she has been smoking cigarettes. She has been smoking about 0.50 packs per day. She has never used smokeless tobacco. She reports current alcohol use. She reports that she does not use drugs.  ROS: A complete review of systems was performed.  All systems are negative except for pertinent findings as noted.  Physical Exam:  Vital signs in last 24 hours: Temp:  [98.9 F (37.2 C)] 98.9 F (37.2 C) (12/06 0954) Pulse Rate:  [92] 92 (12/06 0954) Resp:  [18] 18 (12/06 0954) BP: (126)/(76) 126/76 (12/06 0954) SpO2:  [99 %] 99 % (12/06 0954) Weight:  [126.6 kg] 126.6 kg (12/06 0954) Constitutional:  Alert and oriented, No acute distress Cardiovascular: Regular rate and rhythm Respiratory: Normal respiratory effort, Lungs clear bilaterally GI: Abdomen is soft, nontender, nondistended, no abdominal masses GU: No CVA tenderness Lymphatic: No lymphadenopathy Neurologic: Grossly intact, no focal deficits Psychiatric: Normal mood and  affect  Laboratory Data:  Recent Labs    01/15/20 1040  HGB 11.9*  HCT 35.0*    Recent Labs    01/15/20 1040  NA 139  K 4.5  CL 108  GLUCOSE 85  BUN 11  CREATININE 0.60  Results for orders placed or performed during the hospital encounter of 01/15/20 (from the past 24 hour(s))  Pregnancy, urine POC     Status: None   Collection Time: 01/15/20 10:10 AM  Result Value Ref Range   Preg Test, Ur NEGATIVE NEGATIVE  I-STAT, chem 8     Status: Abnormal   Collection Time: 01/15/20 10:40 AM  Result Value Ref Range   Sodium 139 135 - 145 mmol/L   Potassium 4.5 3.5 - 5.1 mmol/L   Chloride 108 98 - 111 mmol/L   BUN 11 6 - 20 mg/dL   Creatinine, Ser 5.17 0.44 - 1.00 mg/dL   Glucose, Bld 85 70 - 99 mg/dL   Calcium, Ion 6.16 (HH) 1.15 - 1.40 mmol/L   TCO2 23 22 - 32 mmol/L   Hemoglobin 11.9 (L) 12.0 - 15.0 g/dL   HCT 07.3 (L) 36 - 46 %   Comment NOTIFIED PHYSICIAN    Recent Results (from the past 240 hour(s))  SARS CORONAVIRUS 2 (TAT 6-24 HRS) Nasopharyngeal Nasopharyngeal Swab     Status: None   Collection Time: 01/11/20 12:30 PM   Specimen: Nasopharyngeal Swab  Result Value Ref Range Status   SARS Coronavirus 2 NEGATIVE NEGATIVE Final    Comment: (NOTE) SARS-CoV-2 target nucleic acids are NOT DETECTED.  The SARS-CoV-2 RNA is generally detectable in upper and lower respiratory specimens during the acute phase of infection. Negative results do not preclude SARS-CoV-2 infection, do not rule out co-infections with other pathogens, and should not be used as the sole basis for treatment or other patient management decisions. Negative results must be combined with clinical observations, patient history, and epidemiological information. The expected result is Negative.  Fact Sheet for Patients: HairSlick.no  Fact Sheet for Healthcare Providers: quierodirigir.com  This test is not yet approved or cleared by the [place] FDA and  has been authorized for detection and/or diagnosis of SARS-CoV-2 by FDA under an Emergency Use Authorization (EUA). This EUA will remain  in effect (meaning this test can be used) for the duration of the COVID-19 declaration under Se ction 564(b)(1) of the Act, 21 U.S.C. section 360bbb-3(b)(1), unless the authorization is terminated or revoked sooner.  Performed at [facility], [address], [place], [place] [address]     Renal Function: Recent Labs    01/15/20 1040  CREATININE 0.60   Estimated Creatinine Clearance: 126.7 mL/min (by C-G formula based on SCr of 0.6 mg/dL).  Radiologic Imaging: No results found.  I independently reviewed the above imaging studies.  Assessment and Plan Cozette Braggs Bowditch is a 39 y.o. female with left proximal ureteral stone here for definitive management.  -The risks, benefits and alternatives of cystoscopy, left ureteroscopy with laser lithotripsy, JJ stent placement was discussed with the patient.  Risks include, but are not limited to: bleeding, urinary tract infection, ureteral injury, ureteral stricture disease, chronic pain, urinary symptoms, bladder injury, stent migration, the need for nephrostomy tube placement, MI, CVA, DVT, PE and the inherent risks with general anesthesia.  The patient voices understanding and wishes to proceed.    Matt R. Pharaoh Pio MD 01/15/2020, 10:56 AM  [facility] Pager: [phone]): [phone]

## 2020-01-15 NOTE — Anesthesia Procedure Notes (Signed)
Procedure Name: LMA Insertion Date/Time: 01/15/2020 11:33 AM Performed by: Emelyn Roen D, CRNA Pre-anesthesia Checklist: Patient identified, Emergency Drugs available, Suction available and Patient being monitored Patient Re-evaluated:Patient Re-evaluated prior to induction Oxygen Delivery Method: Circle system utilized Preoxygenation: Pre-oxygenation with 100% oxygen Induction Type: IV induction Ventilation: Mask ventilation without difficulty LMA: LMA inserted LMA Size: 4.0 Tube type: Oral Number of attempts: 1 Placement Confirmation: positive ETCO2 and breath sounds checked- equal and bilateral Tube secured with: Tape Dental Injury: Teeth and Oropharynx as per pre-operative assessment

## 2020-01-15 NOTE — Discharge Instructions (Signed)
   Activity:  You are encouraged to ambulate frequently (about every hour during waking hours) to help prevent blood clots from forming in your legs or lungs.  However, you should not engage in any heavy lifting (> 10-15 lbs), strenuous activity, or straining.   Diet: You should advance your diet as instructed by your physician.  It will be normal to have some bloating, nausea, and abdominal discomfort intermittently.   Prescriptions:  You will be provided a prescription for pain medication to take as needed.  If your pain is not severe enough to require the prescription pain medication, you may take extra strength Tylenol instead which will have less side effects.  You should also take a prescribed stool softener to avoid straining with bowel movements as the prescription pain medication may constipate you.   What to call us about: You should call the office [phone]) if you develop fever > 101 or develop persistent vomiting. Activity:  You are encouraged to ambulate frequently (about every hour during waking hours) to help prevent blood clots from forming in your legs or lungs.  However, you should not engage in any heavy lifting (> 10-15 lbs), strenuous activity, or straining.  You have a ureteral stent draining your kidney. Remove this by pulling on string on Thursday AM.      Post Anesthesia Home Care Instructions  Activity: Get plenty of rest for the remainder of the day. A responsible individual must stay with you for 24 hours following the procedure.  For the next 24 hours, DO NOT: -Drive a car -Advertising copywriter -Drink alcoholic beverages -Take any medication unless instructed by your physician -Make any legal decisions or sign important papers.  Meals: Start with liquid foods such as gelatin or soup. Progress to regular foods as tolerated. Avoid greasy, spicy, heavy foods. If nausea and/or vomiting occur, drink only clear liquids until the nausea and/or vomiting subsides.  Call your physician if vomiting continues.  Special Instructions/Symptoms: Your throat may feel dry or sore from the anesthesia or the breathing tube placed in your throat during surgery. If this causes discomfort, gargle with warm salt water. The discomfort should disappear within 24 hours.  If you had a scopolamine patch placed behind your ear for the management of post- operative nausea and/or vomiting:  1. The medication in the patch is effective for 72 hours, after which it should be removed.  Wrap patch in a tissue and discard in the trash. Wash hands thoroughly with soap and water. 2. You may remove the patch earlier than 72 hours if you experience unpleasant side effects which may include dry mouth, dizziness or visual disturbances. 3. Avoid touching the patch. Wash your hands with soap and water after contact with the patch.

## 2020-01-15 NOTE — Transfer of Care (Signed)
Immediate Anesthesia Transfer of Care Note  Patient: Virginia Ferguson  Procedure(s) Performed: CYSTOSCOPY/RETROGRADE/URETEROSCOPY/HOLMIUM LASER/STENT PLACEMENT (Left Ureter)  Patient Location: PACU  Anesthesia Type:General  Level of Consciousness: awake, alert  and oriented  Airway & Oxygen Therapy: Patient Spontanous Breathing and Patient connected to nasal cannula oxygen  Post-op Assessment: Report given to RN and Post -op Vital signs reviewed and stable  Post vital signs: Reviewed and stable  Last Vitals:  Vitals Value Taken Time  BP 156/101 01/15/20 1230  Temp    Pulse 79 01/15/20 1233  Resp 17 01/15/20 1233  SpO2 100 % 01/15/20 1233  Vitals shown include unvalidated device data.  Last Pain:  Vitals:   01/15/20 0954  TempSrc: Oral  PainSc: 0-No pain      Patients Stated Pain Goal: 5 (01/15/20 0954)  Complications: No complications documented.

## 2020-01-15 NOTE — Op Note (Signed)
Operative Note  Preoperative diagnosis:  1.  Left ureteral stone  Postoperative diagnosis: 1.  Left ureteral stone  Procedure(s): 1.  Cystoscopy 2. Left ureteroscopy with laser lithotripsy and basket extraction of stone 3.  Left retrograde pyelogram 4.  Left ureteral stent removal x2 5.  Left ureteral stent exchange 6.  Fluoroscopy less than 1 hour with intraoperative interpretation  Surgeon: [name], MD  Assistants:  None  Anesthesia:  General  Complications:  None  EBL: Minimal  Specimens: 1.  Stone for stone analysis  Drains/Catheters: 1.  6Fr x 26 cm left ureteral stent draining the left lower pole moiety  Intraoperative findings:   1. 5 mm proximal left ureteral stone in the left lower pole moiety.  Successfully fragmented and basket extracted. 2. Cystoscopy demonstrated no suspicious lesions of the bladder 3. 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  There is no filling defects.  The calyces drained promptly.  Left retrograde pyelogram of the left lower pole moiety also demonstrated no hydronephrosis.  There were no filling defects. 4. Left ureteroscopy identified a 5 mm left proximal ureteral stone in the lower pole moiety which was successfully fragment best extracted.  There is no further stones in the left lower pole moiety or the left upper pole moiety or along the course of both left ureters at the completion of the case. 5. Successful placement of left lower pole moiety ureteral stent with a curl within the renal pelvis and bladder respectively.  Note that this was attached to a tether string.  Indication:  Virginia Ferguson is a 40 y.o. female with an obstructing left proximal ureteral stone measuring 5 mm with associated pyelonephritis s/p left ureteral stent placement x2 on 12/23/2019 as she had findings of duplicated left collecting system.  She was discharged home on  12/25/2019 after undergoing a course of inpatient IV antibiotics and she was placed on p.o. antibiotic course.  She tolerated this well.  She denies any fevers, chills, dysuria.  She presents today for definitive treatment of her ureteral stone.  Description of procedure: After informed consent was obtained from the patient, the patient was identified and taken to the operating room and placed in the supine position.  General anesthesia was administered as well as perioperative IV antibiotics.  At the beginning of the case, a time-out was performed to properly identify the patient, the surgery to be performed, and the surgical site.  Sequential compression devices were applied to the lower extremities at the beginning of the case for DVT prophylaxis.  The patient was then placed in the dorsal lithotomy supine position, prepped and draped in sterile fashion.  Preliminary scout fluoroscopy revealed that there was a 5 mm calcification area at the left proximal ureter, which corresponds to the  stone found on the preoperative CT scan. We then passed the 21-French rigid cystoscope through the urethra and into the bladder under vision without any difficulty, noting a normal urethra without strictures.  A systematic evaluation of the bladder revealed no evidence of any suspicious bladder lesions.  Ureteral orifices were in normal position.    The distal aspect of both the left upper pole and left lower pole moieties ureteral stent was seen protruding from the ureteral orifice.  We then used the alligator-tooth forceps and grasped the distal end of the ureteral stent and brought it out the urethral meatus while watching the proximal coil straighten out nicely on fluoroscopy. This  was done for the adjacent stent. Through the ureteral stents, we then passed a 0.038 sensor wire up to the level of the renal pelvis.  The ureteral stent was then removed, leaving the sensor wire up the ureter.    A semi-rigid ureteroscope  was passed alongside the wire up the distal ureter which appeared normal.  We encountered a 5 mm left proximal ureteral stone in the left lower pole moiety. Using the 200 micron holmium laser fiber, the stone was fragmented completely. A 2.2 Fr zero tip basket was used to remove the fragments under visual guidance. These were sent for chemical analysis. With the ureteroscope in the kidney, a gentle pyelogram was performed to delineate the calyceal system and we evaluated the calyces systematically. We encountered no further stones. The rest of the stone fragments were very tiny and these were  irrigated away gently. The calyces were re-inspected and there were no significant stone fragment residual.  We also surveyed the left lower pole moiety with a digital ureteroscope as well as the left upper pole moiety with a digital ureteroscope.  There were no further stones identified.  We then withdrew the ureteroscope back down the ureter along with the access sheath, noting no evidence of any stones along the course of the ureter.  Prior to removing the ureteroscope, we did pass the Glidewire back up to the ureter to the renal pelvis.    Once the ureteroscope was removed, the Glidewire was backloaded through the rigid cystoscope, which was then advanced down the urethra and into the bladder. We then used the Glidewire under direct vision through the rigid cystoscope and under fluoroscopic guidance and passed up a 6-French, 26 cm double-pigtail ureteral stent up left lower pole moiety ureter, making sure that the proximal and distal ends coiled within the kidney and bladder respectively.  Note that we left a long tether string attached to the distal end of the ureteral stent and it exited the urethral meatus and was secured to the inner thigh with a tegaderm adhesive.  The cystoscope was then advanced back into the bladder under vision.  We were able to see the distal stent coiling nicely within the bladder.  The  bladder was then emptied with irrigation solution.  The cystoscope was then removed.    The patient tolerated the procedure well and there was no complication. Patient was awoken from anesthesia and taken to the recovery room in stable condition. I was present and scrubbed for the entirety of the case.  Plan:  Patient will be discharged home.  She will remove the stent on Thursday morning by pulling the attached string.  She will follow-up as scheduled for KUB and renal bladder ultrasound in 4 to 6 weeks.   Matt R. Mechel Schutter MD [facility]  Pager: [phone]

## 2020-01-16 ENCOUNTER — Encounter (HOSPITAL_BASED_OUTPATIENT_CLINIC_OR_DEPARTMENT_OTHER): Payer: Self-pay | Admitting: Urology

## 2020-05-02 ENCOUNTER — Ambulatory Visit (HOSPITAL_COMMUNITY)
Admission: EM | Admit: 2020-05-02 | Discharge: 2020-05-02 | Disposition: A | Discharge status: Home / Self Care | Payer: Self-pay | Attending: Internal Medicine | Admitting: Internal Medicine

## 2020-05-02 ENCOUNTER — Other Ambulatory Visit: Payer: Self-pay

## 2020-05-02 ENCOUNTER — Encounter (HOSPITAL_COMMUNITY): Payer: Self-pay | Admitting: Emergency Medicine

## 2020-05-02 DIAGNOSIS — R079 Chest pain, unspecified: Secondary | ICD-10-CM

## 2020-05-02 DIAGNOSIS — M940 Chondrocostal junction syndrome [Tietze]: Secondary | ICD-10-CM

## 2020-05-02 DIAGNOSIS — K047 Periapical abscess without sinus: Secondary | ICD-10-CM

## 2020-05-02 MED ORDER — IBUPROFEN 800 MG PO TABS: 800.0000 mg | ORAL_TABLET | Freq: Three times a day (TID) | ORAL | 0 refills | Status: DC

## 2020-05-02 MED ORDER — KETOROLAC TROMETHAMINE 60 MG/2ML IM SOLN
30.0000 mg | Freq: Once | INTRAMUSCULAR | Status: AC
  Administered 2020-05-02: 30 mg via INTRAMUSCULAR

## 2020-05-02 MED ORDER — PENICILLIN V POTASSIUM 500 MG PO TABS: 500.0000 mg | ORAL_TABLET | Freq: Four times a day (QID) | ORAL | 0 refills | Status: AC

## 2020-05-02 MED ORDER — KETOROLAC TROMETHAMINE 30 MG/ML IJ SOLN
INTRAMUSCULAR | Status: AC
  Filled 2020-05-02: qty 1

## 2020-05-02 MED ORDER — LIDOCAINE VISCOUS HCL 2 % MT SOLN: 15.0000 mL | OROMUCOSAL | 0 refills | Status: DC | PRN

## 2020-05-02 MED ORDER — CHLORHEXIDINE GLUCONATE 0.12% ORAL RINSE (MEDLINE KIT)
15.0000 mL | Freq: Two times a day (BID) | OROMUCOSAL | 0 refills | Status: DC
Start: 2020-05-02 — End: 2021-05-11

## 2020-05-02 NOTE — ED Provider Notes (Signed)
[place]    CSN: [id number] Arrival date & time: 05/02/20  1330      History   Chief Complaint Chief Complaint  Patient presents with  . Dental Pain  . Chest Pain    HPI Virginia Ferguson is a 40 y.o. female comes to the urgent care with complaints of tooth ache over the past couple of days.  Patient describes the tooth ache as throbbing, severe, aggravated by biting down and no known relieving factors.  Patient has poor dental hygiene.  No pain with swallowing.  Patient also complains of right-sided chest pain which started this morning.  Pain is sharp, located in the right side of the chest, aggravated by movement and has no known relieving factors.  No shortness of breath, no radiation of pain, no fever or chills.  No nausea, vomiting or diarrhea.   No falls or trauma to the chest.    HPI  Past Medical History:  Diagnosis Date  . History of kidney stones   . Iron deficiency anemia   . Medical history non-contributory     Patient Active Problem List   Diagnosis Date Noted  . Left ureteral stone 12/23/2019  . Left flank pain 12/22/2019  . Left sided abdominal pain of unknown cause 12/22/2019    Past Surgical History:  Procedure Laterality Date  . CYSTOSCOPY W/ URETERAL STENT PLACEMENT Left 12/23/2019   Procedure: CYSTOSCOPY WITH LEFT RETROGRADE PYELOGRAM/URETERAL STENT PLACEMENT X2;  Surgeon: Janith Lima, MD;  Location: [facility];  Service: Urology;  Laterality: Left;  . CYSTOSCOPY/URETEROSCOPY/HOLMIUM LASER/STENT PLACEMENT Left 01/15/2020   Procedure: CYSTOSCOPY/RETROGRADE/URETEROSCOPY/HOLMIUM LASER/STENT PLACEMENT;  Surgeon: Janith Lima, MD;  Location: [facility];  Service: Urology;  Laterality: Left;  ONLY NEEDS 45 MIN  . NO PAST SURGERIES      OB History    Gravida  1   Para  1   Term  1   Preterm      AB      Living  1     SAB      IAB      Ectopic      Multiple      Live Births               Home  Medications    Prior to Admission medications   Medication Sig Start Date End Date Taking? Authorizing Provider  chlorhexidine gluconate, MEDLINE KIT, (PERIDEX) 0.12 % solution Use as directed 15 mLs in the mouth or throat 2 (two) times daily. 05/02/20  Yes Quintasia Theroux, Myrene Galas, MD  ibuprofen (ADVIL) 800 MG tablet Take 1 tablet (800 mg total) by mouth 3 (three) times daily. 05/02/20  Yes Dorthy Magnussen, Myrene Galas, MD  lidocaine (XYLOCAINE) 2 % solution Use as directed 15 mLs in the mouth or throat as needed for mouth pain. 05/02/20  Yes Ruel Dimmick, Myrene Galas, MD  penicillin v potassium (VEETID) 500 MG tablet Take 1 tablet (500 mg total) by mouth 4 (four) times daily for 7 days. 05/02/20 05/09/20 Yes Darianny Momon, Myrene Galas, MD  acetaminophen (TYLENOL) 500 MG tablet Take 1,000 mg by mouth every 6 (six) hours as needed.    [provider]  docusate sodium (COLACE) 100 MG capsule Take 1 capsule (100 mg total) by mouth daily as needed for up to 30 doses for mild constipation. 01/15/20   Janith Lima, MD  mirabegron ER (MYRBETRIQ) 25 MG TB24 tablet Take 25 mg by mouth See admin instructions. Take 25mg   daily x 7 days then increase to $RemoveBef'50mg'URnmliWmoQ$  daily.    [provider]  mirabegron ER (MYRBETRIQ) 50 MG TB24 tablet Take 50 mg by mouth daily.    [provider]  oxyCODONE-acetaminophen (PERCOCET) 5-325 MG tablet Take 1 tablet by mouth every 4 (four) hours as needed for up to 12 doses for severe pain. 01/15/20   Janith Lima, MD  tamsulosin (FLOMAX) 0.4 MG CAPS capsule Take 1 capsule (0.4 mg total) by mouth daily. 12/23/19   Janith Lima, MD    Family History Family History  Problem Relation Age of Onset  . Hypertension Mother   . Diabetes Mother     Social History Social History   Tobacco Use  . Smoking status: Current Some Day Smoker    Packs/day: 0.50    Types: Cigarettes  . Smokeless tobacco: Never Used  Vaping Use  . Vaping Use: Never used  Substance Use Topics  . Alcohol use: Yes     Comment: occasional  . Drug use: No     Allergies   Patient has no known allergies.   Review of Systems Review of Systems  HENT: Positive for dental problem.   Respiratory: Negative for cough, shortness of breath and wheezing.   Cardiovascular: Positive for chest pain. Negative for palpitations.  Musculoskeletal: Negative.   Neurological: Negative.  Negative for weakness and headaches.     Physical Exam Triage Vital Signs ED Triage Vitals  Enc Vitals Group     BP 05/02/20 1409 104/65     Pulse Rate 05/02/20 1409 87     Resp 05/02/20 1409 19     Temp 05/02/20 1409 99.1 F (37.3 C)     Temp Source 05/02/20 1409 Oral     SpO2 05/02/20 1409 100 %     Weight --      Height --      Head Circumference --      Peak Flow --      Pain Score 05/02/20 1407 10     Pain Loc --      Pain Edu? --      Excl. in [place]? --    No data found.  Updated Vital Signs BP 104/65 (BP Location: Left Arm)   Pulse 87   Temp 99.1 F (37.3 C) (Oral)   Resp 19   LMP 04/23/2020   SpO2 100%   Visual Acuity Right Eye Distance:   Left Eye Distance:   Bilateral Distance:    Right Eye Near:   Left Eye Near:    Bilateral Near:     Physical Exam Vitals and nursing note reviewed.  Constitutional:      General: She is not in acute distress.    Appearance: She is not ill-appearing.  Cardiovascular:     Rate and Rhythm: Normal rate and regular rhythm.     Heart sounds: Normal heart sounds.  Pulmonary:     Effort: Pulmonary effort is normal. No tachypnea or accessory muscle usage.     Breath sounds: Normal breath sounds. No decreased breath sounds, wheezing or rhonchi.  Chest:     Chest wall: Tenderness present.     Comments: Reproducible tenderness on palpation over the right costochondral joints. Abdominal:     General: There is no abdominal bruit.     Palpations: Abdomen is soft. There is no hepatomegaly or splenomegaly.  Musculoskeletal:     Cervical back: Normal range of motion.   Neurological:     Mental  Status: She is alert.      UC Treatments / Results  Labs (all labs ordered are listed, but only abnormal results are displayed) Labs Reviewed - No data to display  EKG   Radiology No results found.  Procedures Procedures (including critical care time)  Medications Ordered in UC Medications  ketorolac (TORADOL) injection 30 mg (30 mg Intramuscular Given 05/02/20 1508)    Initial Impression / Assessment and Plan / UC Course  I have reviewed the triage vital signs and the nursing notes.  Pertinent labs & imaging results that were available during my care of the patient were reviewed by me and considered in my medical decision making (see chart for details).     1.  Costochondritis: Warm compress Ibuprofen every 8 hours as needed for pain Gentle range of motion exercises Return to urgent care if symptoms worsen  2.  Dental infection: Pen-Vee K 500 mg 4 times daily for 7 days Chlorhexidine mouthwash Lidocaine oral gel Affordable dental resources given to patient to follow-up with the dentist.  Patient will need dental work done. Final Clinical Impressions(s) / UC Diagnoses   Final diagnoses:  Costochondritis  Dental abscess     Discharge Instructions     Please use medications as directed Please reach out to your dentist to have dental work done (probably tooth extraction) If you have worsening pain please return to the urgent care to be reevaluated    ED Prescriptions    Medication Sig Dispense Auth. Provider   penicillin v potassium (VEETID) 500 MG tablet Take 1 tablet (500 mg total) by mouth 4 (four) times daily for 7 days. 28 tablet Shamari Trostel, Myrene Galas, MD   ibuprofen (ADVIL) 800 MG tablet Take 1 tablet (800 mg total) by mouth 3 (three) times daily. 21 tablet Emelina Hinch, Myrene Galas, MD   chlorhexidine gluconate, MEDLINE KIT, (PERIDEX) 0.12 % solution Use as directed 15 mLs in the mouth or throat 2 (two) times daily. 120 mL Tyrah Broers,  Myrene Galas, MD   lidocaine (XYLOCAINE) 2 % solution Use as directed 15 mLs in the mouth or throat as needed for mouth pain. 100 mL Totiana Everson, Myrene Galas, MD     PDMP not reviewed this encounter.   Chase Picket, MD 05/02/20 [place]

## 2020-05-02 NOTE — Discharge Instructions (Addendum)
Please use medications as directed Please reach out to your dentist to have dental work done (probably tooth extraction) If you have worsening pain please return to the urgent care to be reevaluated

## 2020-05-02 NOTE — ED Triage Notes (Signed)
Pt presents with dental pain and abscess. States the abscess developed over night last night and chest pain started today.

## 2020-09-24 ENCOUNTER — Ambulatory Visit (INDEPENDENT_AMBULATORY_CARE_PROVIDER_SITE_OTHER): Payer: Medicaid Other

## 2020-09-24 ENCOUNTER — Ambulatory Visit (HOSPITAL_COMMUNITY)
Admission: EM | Admit: 2020-09-24 | Discharge: 2020-09-24 | Disposition: A | Discharge status: Home / Self Care | Payer: Medicaid Other | Attending: Emergency Medicine | Admitting: Emergency Medicine

## 2020-09-24 ENCOUNTER — Other Ambulatory Visit: Payer: Self-pay

## 2020-09-24 ENCOUNTER — Encounter (HOSPITAL_COMMUNITY): Payer: Self-pay

## 2020-09-24 DIAGNOSIS — S62304A Unspecified fracture of fourth metacarpal bone, right hand, initial encounter for closed fracture: Secondary | ICD-10-CM

## 2020-09-24 DIAGNOSIS — M79641 Pain in right hand: Secondary | ICD-10-CM

## 2020-09-24 MED ORDER — HYDROCODONE-ACETAMINOPHEN 5-325 MG PO TABS
1.0000 | ORAL_TABLET | Freq: Four times a day (QID) | ORAL | 0 refills | Status: DC | PRN
Start: 2020-09-24 — End: 2021-05-11

## 2020-09-24 NOTE — ED Triage Notes (Signed)
Pt states last night she hit her hand and is now hurting. Pt states she is not able to move fingers or open hand, color of skin is WNL.

## 2020-09-24 NOTE — Progress Notes (Signed)
Orthopedic Tech Progress Note Patient Details:  KINZLIE HARNEY 11-29-1980 [id number]  Ortho Devices Type of Ortho Device: Ulna gutter splint, Cotton web roll Ortho Device/Splint Location: RUE Ortho Device/Splint Interventions: Ordered, Application, Adjustment   Post Interventions Patient Tolerated: Well Instructions Provided: Care of device  Donald Pore 09/24/2020, 3:33 PM

## 2020-09-24 NOTE — Discharge Instructions (Addendum)
You can take the hydrocodone-acetaminophen as needed for pain.  You can also take Ibuprofen as needed.   Rest as much as possible Ice for 10-15 minutes every 4-6 hours as needed for pain and swelling Elevate above your heart when sitting and laying down  Follow up with [facility] as soon as possible.

## 2020-09-24 NOTE — ED Provider Notes (Signed)
[place]    CSN: [id number] Arrival date & time: 09/24/20  1234      History   Chief Complaint Chief Complaint  Patient presents with   Hand Injury    HPI Virginia Ferguson is a 40 y.o. female.   Patient here for evaluation of right hand pain and swelling.  Reports punching a wall around 1am this morning and has had pain and swelling since.  Has not tried any OTC medication or treatment.  Reports pain is worse with movement.  Denies any specific alleviating or aggravating factors.  Denies any fevers, chest pain, shortness of breath, N/V/D, numbness, tingling, weakness, abdominal pain, or headaches.    The history is provided by the patient.  Hand Injury  Past Medical History:  Diagnosis Date   History of kidney stones    Iron deficiency anemia    Medical history non-contributory     Patient Active Problem List   Diagnosis Date Noted   Left ureteral stone 12/23/2019   Left flank pain 12/22/2019   Left sided abdominal pain of unknown cause 12/22/2019    Past Surgical History:  Procedure Laterality Date   CYSTOSCOPY W/ URETERAL STENT PLACEMENT Left 12/23/2019   Procedure: CYSTOSCOPY WITH LEFT RETROGRADE PYELOGRAM/URETERAL STENT PLACEMENT X2;  Surgeon: Janith Lima, MD;  Location: [place];  Service: Urology;  Laterality: Left;   CYSTOSCOPY/URETEROSCOPY/HOLMIUM LASER/STENT PLACEMENT Left 01/15/2020   Procedure: CYSTOSCOPY/RETROGRADE/URETEROSCOPY/HOLMIUM LASER/STENT PLACEMENT;  Surgeon: Janith Lima, MD;  Location: [facility];  Service: Urology;  Laterality: Left;  ONLY NEEDS 45 MIN   NO PAST SURGERIES      OB History     Gravida  1   Para  1   Term  1   Preterm      AB      Living  1      SAB      IAB      Ectopic      Multiple      Live Births               Home Medications    Prior to Admission medications   Medication Sig Start Date End Date Taking? Authorizing Provider  HYDROcodone-acetaminophen  (NORCO/VICODIN) 5-325 MG tablet Take 1-2 tablets by mouth every 6 (six) hours as needed. 09/24/20  Yes Pearson Forster, NP  chlorhexidine gluconate, MEDLINE KIT, (PERIDEX) 0.12 % solution Use as directed 15 mLs in the mouth or throat 2 (two) times daily. 05/02/20   Lamptey, Myrene Galas, MD  docusate sodium (COLACE) 100 MG capsule Take 1 capsule (100 mg total) by mouth daily as needed for up to 30 doses for mild constipation. 01/15/20   Janith Lima, MD  ibuprofen (ADVIL) 800 MG tablet Take 1 tablet (800 mg total) by mouth 3 (three) times daily. 05/02/20   Lamptey, Myrene Galas, MD  lidocaine (XYLOCAINE) 2 % solution Use as directed 15 mLs in the mouth or throat as needed for mouth pain. 05/02/20   Chase Picket, MD  mirabegron ER (MYRBETRIQ) 25 MG TB24 tablet Take 25 mg by mouth See admin instructions. Take 32m daily x 7 days then increase to 542mdaily.    [provider]  mirabegron ER (MYRBETRIQ) 50 MG TB24 tablet Take 50 mg by mouth daily.    [provider]  tamsulosin (FLOMAX) 0.4 MG CAPS capsule Take 1 capsule (0.4 mg total) by mouth daily. 12/23/19   GaJanith LimaMD  Family History Family History  Problem Relation Age of Onset   Hypertension Mother    Diabetes Mother     Social History Social History   Tobacco Use   Smoking status: Some Days    Packs/day: 0.50    Types: Cigarettes   Smokeless tobacco: Never  Vaping Use   Vaping Use: Never used  Substance Use Topics   Alcohol use: Yes    Comment: occasional   Drug use: No     Allergies   Patient has no known allergies.   Review of Systems Review of Systems  Musculoskeletal:  Positive for arthralgias and joint swelling.  All other systems reviewed and are negative.   Physical Exam Triage Vital Signs ED Triage Vitals  Enc Vitals Group     BP 09/24/20 1346 123/73     Pulse Rate 09/24/20 1346 78     Resp --      Temp 09/24/20 1346 98.5 F (36.9 C)     Temp Source 09/24/20 1346 Oral     SpO2  09/24/20 1346 100 %     Weight --      Height --      Head Circumference --      Peak Flow --      Pain Score 09/24/20 1344 8     Pain Loc --      Pain Edu? --      Excl. in [place]? --    No data found.  Updated Vital Signs BP 123/73 (BP Location: Left Arm)   Pulse 78   Temp 98.5 F (36.9 C) (Oral)   LMP 09/03/2020   SpO2 100%   Visual Acuity Right Eye Distance:   Left Eye Distance:   Bilateral Distance:    Right Eye Near:   Left Eye Near:    Bilateral Near:     Physical Exam Vitals and nursing note reviewed.  Constitutional:      General: She is not in acute distress.    Appearance: Normal appearance. She is not ill-appearing, toxic-appearing or diaphoretic.  HENT:     Head: Normocephalic and atraumatic.  Eyes:     Conjunctiva/sclera: Conjunctivae normal.  Cardiovascular:     Rate and Rhythm: Normal rate.     Pulses: Normal pulses.  Pulmonary:     Effort: Pulmonary effort is normal.  Abdominal:     General: Abdomen is flat.  Musculoskeletal:     Right hand: Swelling, deformity, tenderness and bony tenderness present. Decreased range of motion.     Left hand: Normal.     Cervical back: Normal range of motion.  Skin:    General: Skin is warm and dry.  Neurological:     General: No focal deficit present.     Mental Status: She is alert and oriented to person, place, and time.  Psychiatric:        Mood and Affect: Mood normal.     UC Treatments / Results  Labs (all labs ordered are listed, but only abnormal results are displayed) Labs Reviewed - No data to display  EKG   Radiology DG Hand Complete Right  Result Date: 09/24/2020 CLINICAL DATA:  Pain and swelling after hitting hand EXAM: RIGHT HAND - COMPLETE 3+ VIEW COMPARISON:  None. FINDINGS: There is diffuse soft tissue swelling about the wrist. Fracture deformity involving the distal aspect of the fourth metacarpal bone is identified with mild dorsal angulation of the distal fracture fragments. No  additional fracture or subluxation. IMPRESSION: 1.  Acute fracture involves the distal aspect of the fourth metacarpal bone. 2. Soft tissue swelling. Electronically Signed   By: Kerby Moors M.D.   On: 09/24/2020 15:03    Procedures Procedures (including critical care time)  Medications Ordered in UC Medications - No data to display  Initial Impression / Assessment and Plan / UC Course  I have reviewed the triage vital signs and the nursing notes.  Pertinent labs & imaging results that were available during my care of the patient were reviewed by me and considered in my medical decision making (see chart for details).    Xray shows acute fracture of the distal aspect of the fourth metacarpal bone.  Ulnar gutter splint applied in office.  Patient instructed to keep splint dry and follow up with orthopedics as soon as possible for re-evaluation.  May take hydrocodone-acetaminophen as needed for pain.  May take ibuprofen as needed. Recommend rest, ice, and elevation.   Final Clinical Impressions(s) / UC Diagnoses   Final diagnoses:  Closed nondisplaced fracture of fourth metacarpal bone of right hand, unspecified portion of metacarpal, initial encounter     Discharge Instructions      You can take the hydrocodone-acetaminophen as needed for pain.  You can also take Ibuprofen as needed.   Rest as much as possible Ice for 10-15 minutes every 4-6 hours as needed for pain and swelling Elevate above your heart when sitting and laying down  Follow up with [facility] as soon as possible.       ED Prescriptions     Medication Sig Dispense Auth. Provider   HYDROcodone-acetaminophen (NORCO/VICODIN) 5-325 MG tablet Take 1-2 tablets by mouth every 6 (six) hours as needed. 10 tablet Pearson Forster, NP      I have reviewed the PDMP during this encounter.   Pearson Forster, NP 09/24/20 1535

## 2021-02-20 ENCOUNTER — Encounter (HOSPITAL_COMMUNITY): Payer: Self-pay

## 2021-02-20 ENCOUNTER — Emergency Department (HOSPITAL_COMMUNITY): Payer: Self-pay

## 2021-02-20 ENCOUNTER — Emergency Department (HOSPITAL_COMMUNITY)
Admission: EM | Admit: 2021-02-20 | Discharge: 2021-02-20 | Disposition: A | Discharge status: Home / Self Care | Payer: Self-pay | Attending: Emergency Medicine | Admitting: Emergency Medicine

## 2021-02-20 DIAGNOSIS — S52201A Unspecified fracture of shaft of right ulna, initial encounter for closed fracture: Secondary | ICD-10-CM | POA: Insufficient documentation

## 2021-02-20 DIAGNOSIS — W228XXA Striking against or struck by other objects, initial encounter: Secondary | ICD-10-CM | POA: Insufficient documentation

## 2021-02-20 MED ORDER — HYDROCODONE-ACETAMINOPHEN 5-325 MG PO TABS: 1.0000 | ORAL_TABLET | Freq: Four times a day (QID) | ORAL | 0 refills | Status: DC | PRN

## 2021-02-20 MED ORDER — KETOROLAC TROMETHAMINE 15 MG/ML IJ SOLN
15.0000 mg | Freq: Once | INTRAMUSCULAR | Status: AC
  Administered 2021-02-20: 15 mg via INTRAMUSCULAR
  Filled 2021-02-20: qty 1
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
## 2021-02-20 NOTE — ED Triage Notes (Signed)
Pt presents with c/o right wrist pain. Pt reports she hit her wrist on the steering wheel in her car while attempting to break up the ice in her cup.

## 2021-02-20 NOTE — Discharge Instructions (Addendum)
Return for any problem.   Keep splint clean and dry. Support right arm with sling.  Follow up with DR. VARKEY (Orthopedics) as instructed.  Use Norco as prescribed for pain.

## 2021-05-08 ENCOUNTER — Ambulatory Visit (HOSPITAL_COMMUNITY): Payer: Self-pay

## 2021-05-09 ENCOUNTER — Ambulatory Visit (HOSPITAL_COMMUNITY): Payer: Self-pay

## 2021-05-11 ENCOUNTER — Ambulatory Visit (HOSPITAL_COMMUNITY)
Admission: EM | Admit: 2021-05-11 | Discharge: 2021-05-11 | Disposition: A | Discharge status: Home / Self Care | Payer: Medicaid Other | Attending: Family Medicine | Admitting: Family Medicine

## 2021-05-11 ENCOUNTER — Other Ambulatory Visit: Payer: Self-pay

## 2021-05-11 ENCOUNTER — Encounter (HOSPITAL_COMMUNITY): Payer: Self-pay | Admitting: Emergency Medicine

## 2021-05-11 DIAGNOSIS — M542 Cervicalgia: Secondary | ICD-10-CM

## 2021-05-11 DIAGNOSIS — M545 Low back pain, unspecified: Secondary | ICD-10-CM

## 2021-05-11 MED ORDER — CYCLOBENZAPRINE HCL 5 MG PO TABS: 5.0000 mg | ORAL_TABLET | Freq: Two times a day (BID) | ORAL | 0 refills | Status: DC | PRN

## 2021-05-11 MED ORDER — IBUPROFEN 800 MG PO TABS: 800.0000 mg | ORAL_TABLET | Freq: Three times a day (TID) | ORAL | 0 refills | Status: DC

## 2021-05-11 MED ORDER — KETOROLAC TROMETHAMINE 60 MG/2ML IM SOLN
60.0000 mg | Freq: Once | INTRAMUSCULAR | Status: AC
  Administered 2021-05-11: 60 mg via INTRAMUSCULAR

## 2021-05-11 MED ORDER — KETOROLAC TROMETHAMINE 60 MG/2ML IM SOLN
INTRAMUSCULAR | Status: AC
  Filled 2021-05-11: qty 2
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
## 2021-05-22 IMAGING — DX DG CHEST 1V PORT
1 series · 1 of 1 positions shown · non-contrast
Comparison: 11/28/2014

CLINICAL DATA: Chest pain and fever

EXAM:
PORTABLE CHEST 1 VIEW

[chest ap]
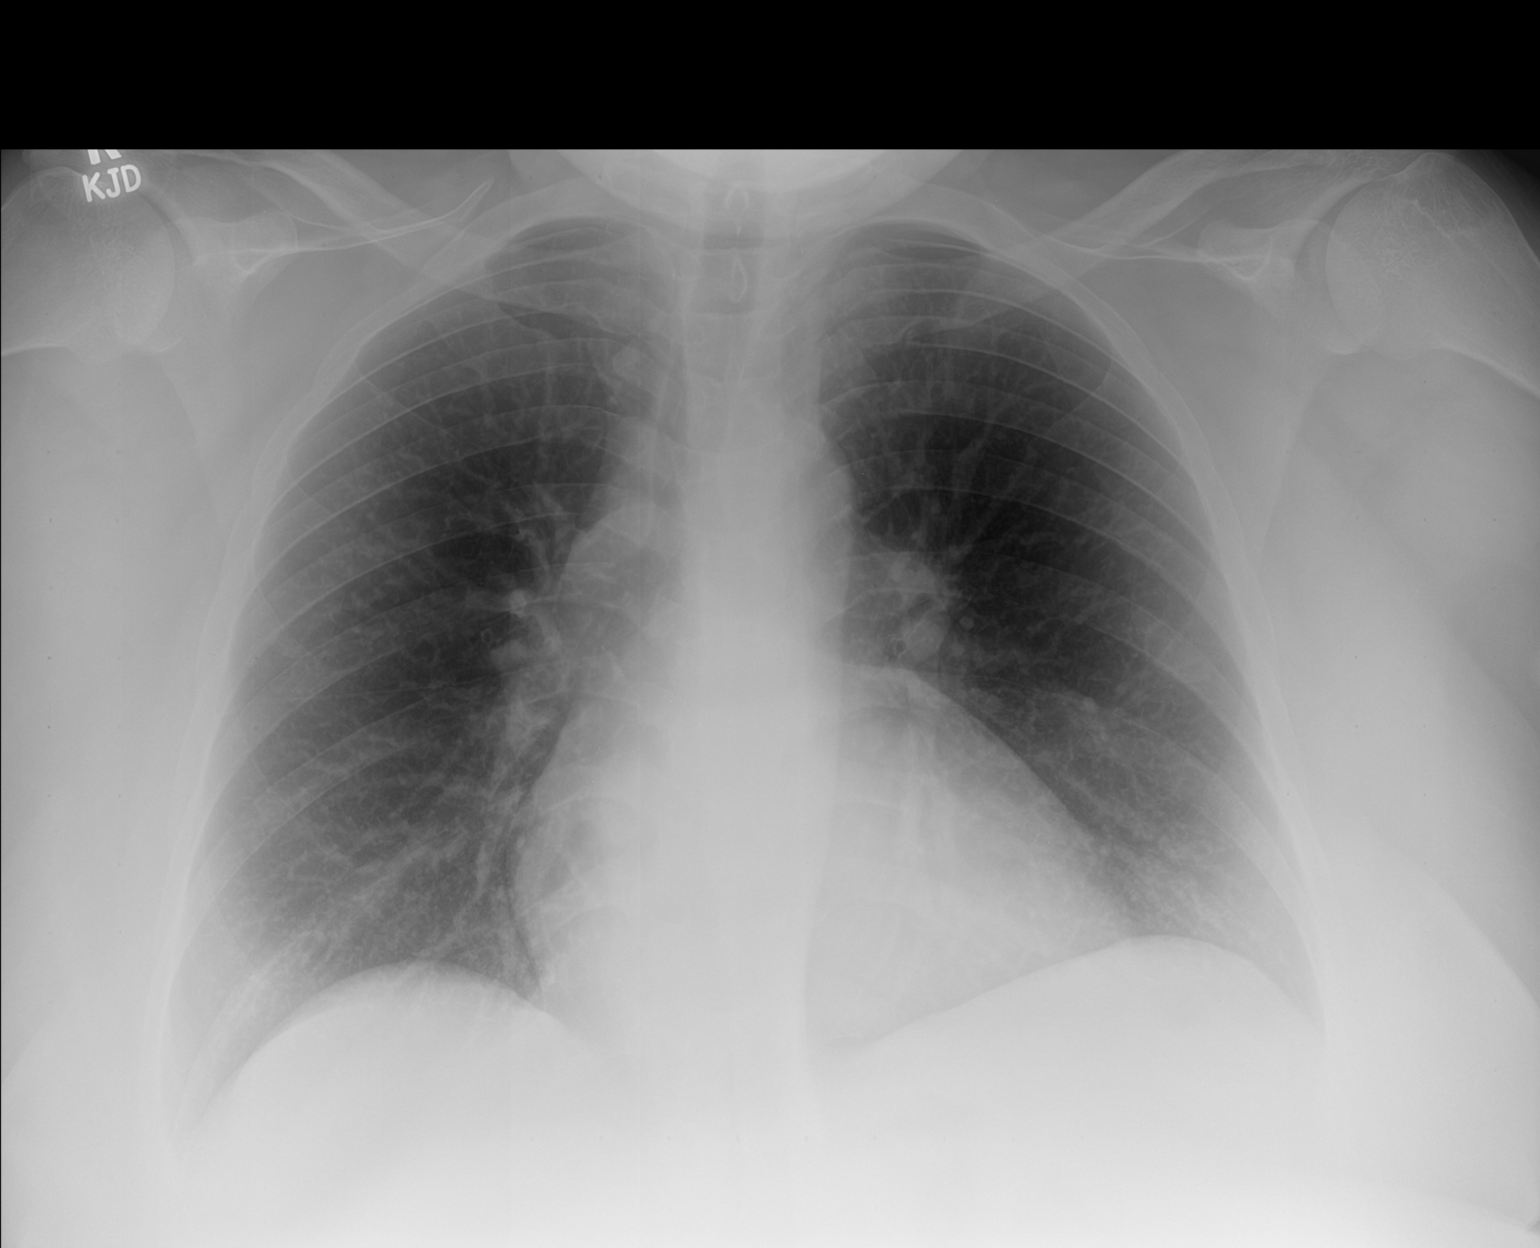

[1 of 1 positions shown; findings below may reference images not displayed]

FINDINGS: The heart size and mediastinal contours are within normal limits.
Both lungs are clear. The visualized skeletal structures are
unremarkable.
IMPRESSION: No active disease.

## 2021-05-24 IMAGING — US US RENAL
1 series · 14 of 25 positions shown · non-contrast
Comparison: CT abdomen and pelvis December 23, 2019

CLINICAL DATA: Flank pain

EXAM:
RENAL / URINARY TRACT ULTRASOUND COMPLETE

[Series 1: us renal · 32 acquisitions, 14 frames shown]
[im 1/32]
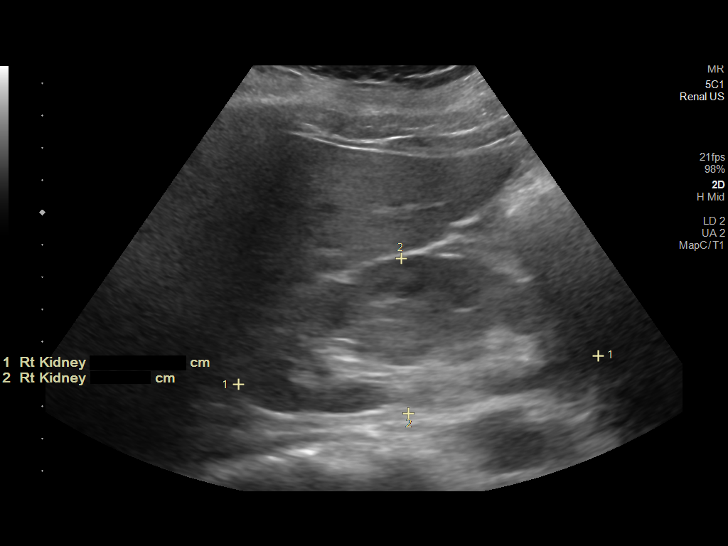
[im 3/32]
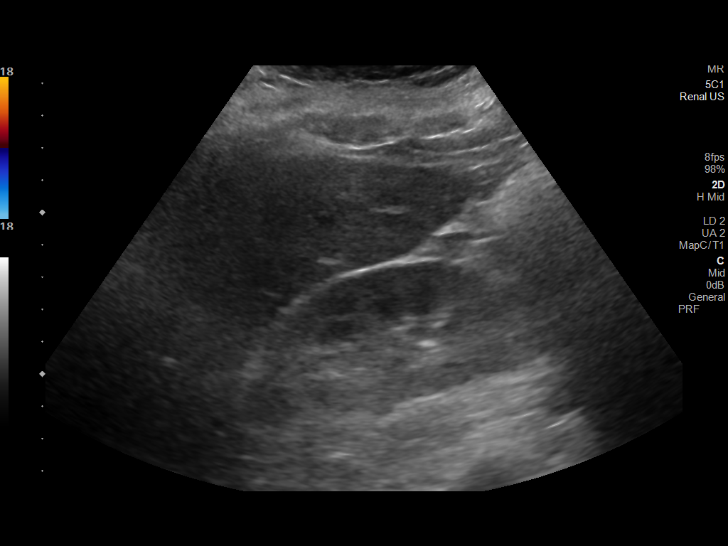
[im 6/32]
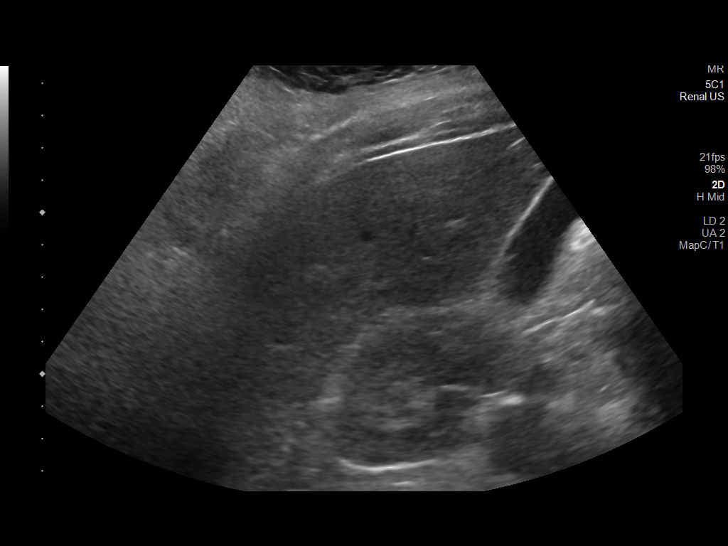
[im 8/32]
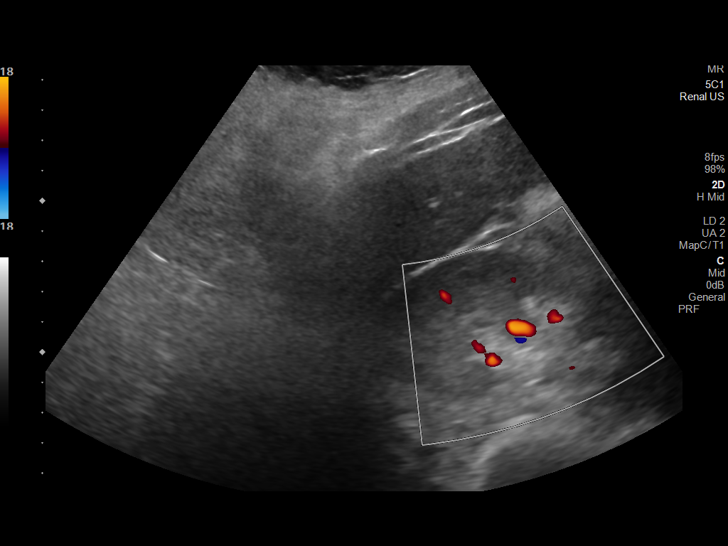
[im 11/32]
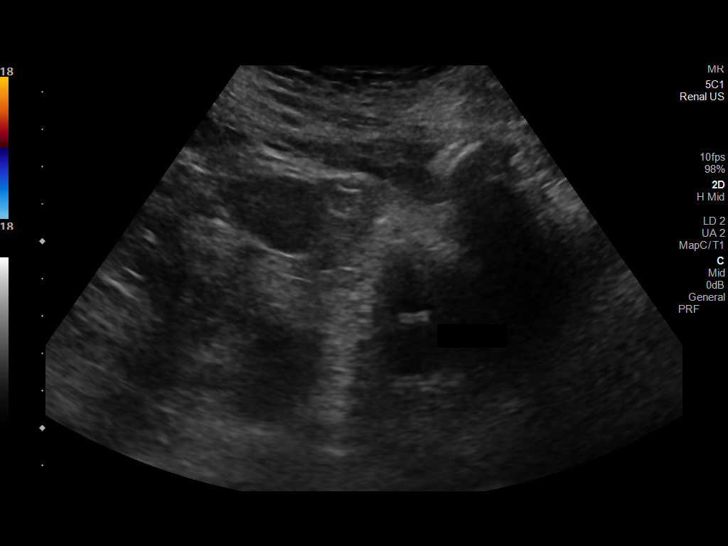
[im 12/32]
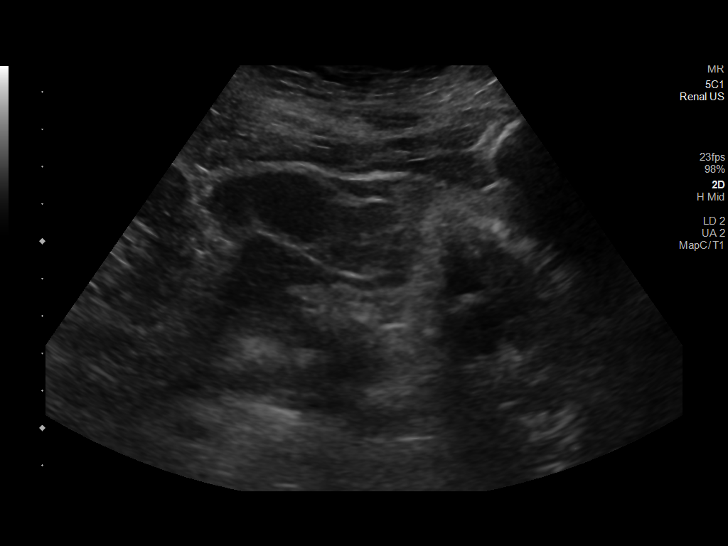
[im 15/32]
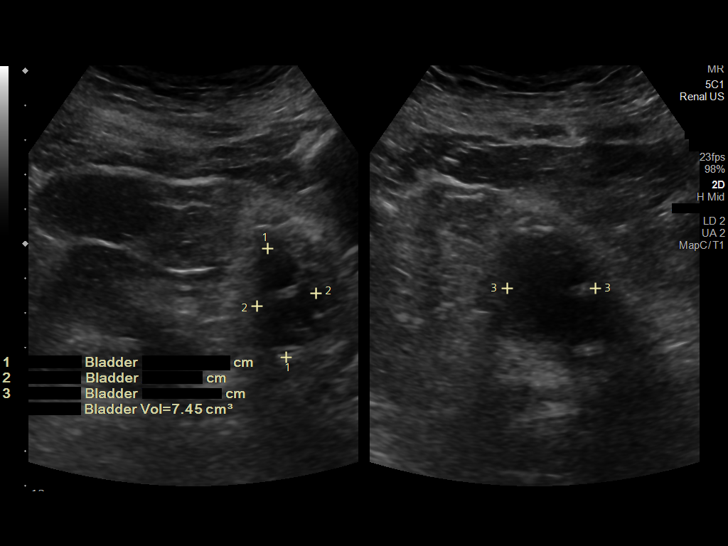
[im 17/32]
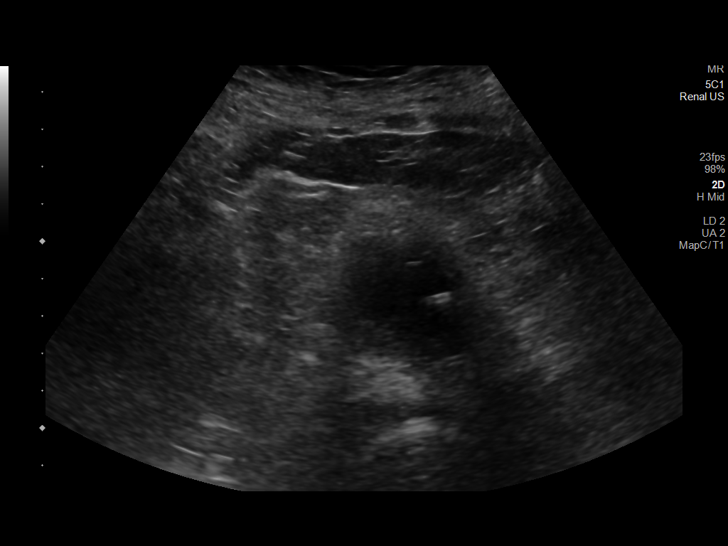
[im 20/32]
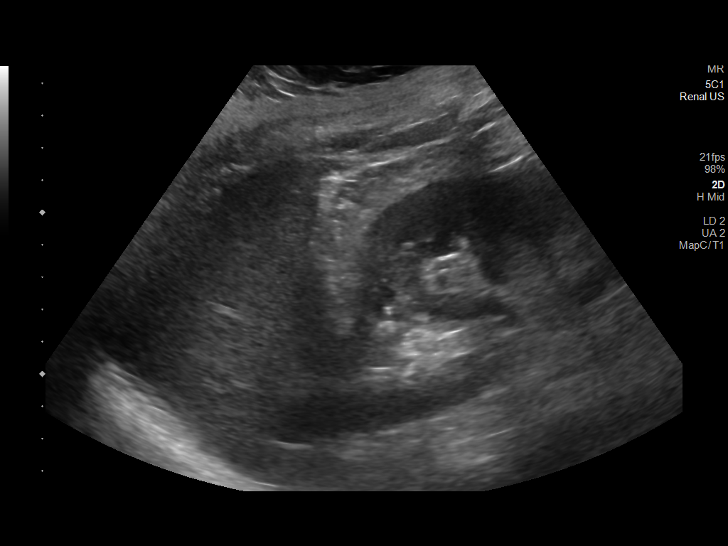
[im 21/32]
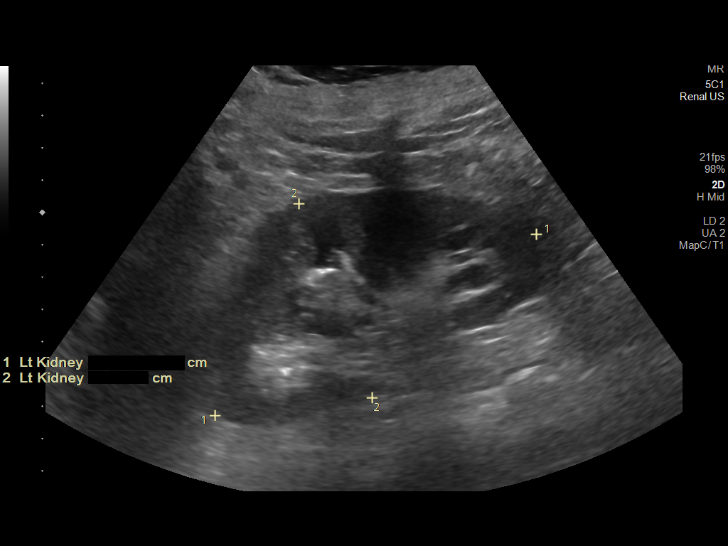
[im 24/32]
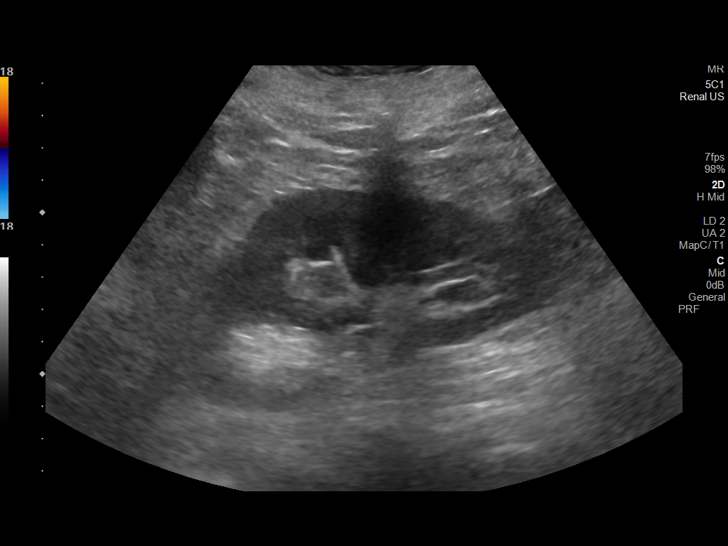
[im 26/32]
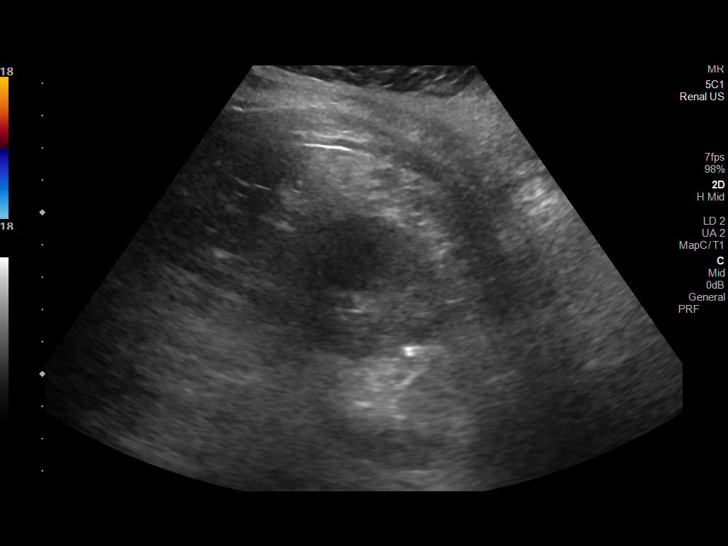
[im 29/32]
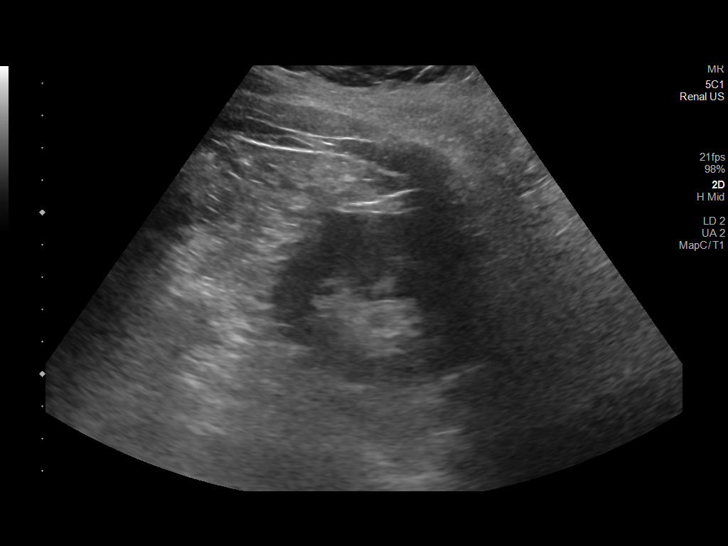
[im 32/32]
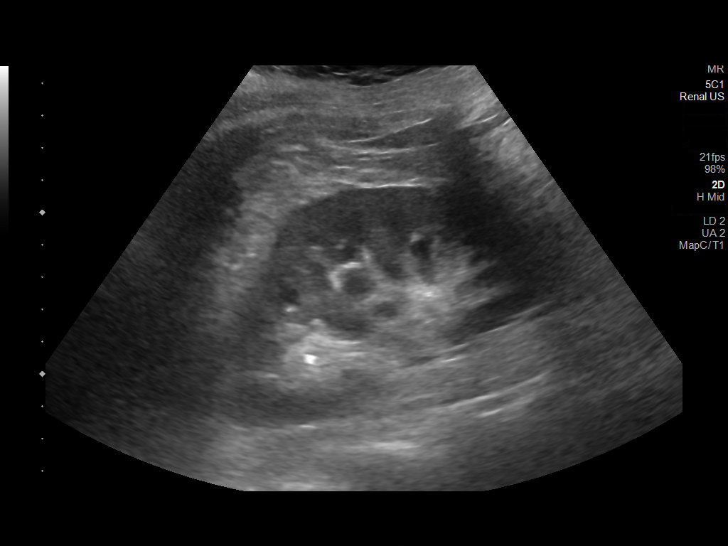

[14 of 25 positions shown; findings below may reference images not displayed]

FINDINGS: Right Kidney:

Renal measurements: 11.2 x 4.8 x 5.5 cm = volume: 155 mL.
Echogenicity and renal cortical thickness are within normal limits.
No mass, perinephric fluid, or hydronephrosis visualized. No
sonographically demonstrable calculus or ureterectasis.

Left Kidney:

Renal measurements: 11.4 x 6.4 x 5.1 cm = volume: 195 mL.
Echogenicity and renal cortical thickness are within normal limits.
No mass or perinephric fluid, or hydronephrosis visualized. A small
portion of an apparent stent is seen in the upper pole kidney
region. No sonographically demonstrable calculus or ureterectasis.

Bladder:

Stent seen in urinary bladder. Urinary bladder is largely
decompressed. No lesions seen in bladder.

Other:

None.
IMPRESSION: Apparent double-J stent seen on the left without hydronephrosis. No
abnormal calcifications evident in either kidney. Kidneys otherwise
appear normal bilaterally by ultrasound.

## 2021-07-06 ENCOUNTER — Ambulatory Visit (HOSPITAL_COMMUNITY)
Admission: EM | Admit: 2021-07-06 | Discharge: 2021-07-06 | Disposition: A | Discharge status: Home / Self Care | Payer: 59 | Attending: Emergency Medicine | Admitting: Emergency Medicine

## 2021-07-06 ENCOUNTER — Encounter (HOSPITAL_COMMUNITY): Payer: Self-pay

## 2021-07-06 DIAGNOSIS — K047 Periapical abscess without sinus: Secondary | ICD-10-CM | POA: Diagnosis not present

## 2021-07-06 MED ORDER — HYDROCODONE-ACETAMINOPHEN 5-325 MG PO TABS: 1.0000 | ORAL_TABLET | ORAL | 0 refills | Status: DC | PRN

## 2021-07-06 MED ORDER — IBUPROFEN 800 MG PO TABS: 800.0000 mg | ORAL_TABLET | Freq: Three times a day (TID) | ORAL | 0 refills | Status: DC

## 2021-07-06 MED ORDER — CLINDAMYCIN HCL 150 MG PO CAPS: 150.0000 mg | ORAL_CAPSULE | Freq: Four times a day (QID) | ORAL | 0 refills | Status: DC

## 2021-07-06 NOTE — ED Provider Notes (Signed)
[facility]   MRN: [id number] DOB: 12-07-1980  Subjective:   Chief Complaint;  Chief Complaint  Patient presents with   Dental Pain   Patient presents to Urgent Care with complaints of dental pain on L side since Friday. Patient reports no dentist and is not sure last visit to dentist. Pt reports chip on a tooth on the side that is causing her issues. Pt . Reports 8/10 pain and swelling on l side today   Virginia Ferguson is a 41 y.o. female presenting for dental pain on the left lower jaw that started a couple of days ago.  She has tried Motrin and Tylenol without relief of her pain.  She started with left jaw swelling as well but denies fever  No current facility-administered medications for this encounter.  Current Outpatient Medications:    cyclobenzaprine (FLEXERIL) 5 MG tablet, Take 1 tablet (5 mg total) by mouth 2 (two) times daily as needed for muscle spasms., Disp: 20 tablet, Rfl: 0   ibuprofen (ADVIL) 800 MG tablet, Take 1 tablet (800 mg total) by mouth 3 (three) times daily., Disp: 21 tablet, Rfl: 0   Not on File  Past Medical History:  Diagnosis Date   History of kidney stones    Iron deficiency anemia    Medical history non-contributory      Review of Systems  All other systems reviewed and are negative.   Objective:   Vitals: BP 124/85 (BP Location: Right Arm)   Pulse 81   Temp 98.4 F (36.9 C) (Oral)   Resp 20   SpO2 98%   Physical Exam Vitals and nursing note reviewed.  Constitutional:      General: She is not in acute distress.    Appearance: She is well-developed.     Comments: Patient appears uncomfortable and is tearful throughout visit  HENT:     Head: Normocephalic and atraumatic.     Mouth/Throat:     Comments: Left lower molars with tenderness to palpation and abscess adjacent in the gum positive tender no gross gingivitis no obvious cracked or missing teeth.  No evidence of facial cellulitis Eyes:      Conjunctiva/sclera: Conjunctivae normal.  Cardiovascular:     Rate and Rhythm: Normal rate and regular rhythm.     Heart sounds: No murmur heard. Pulmonary:     Effort: Pulmonary effort is normal. No respiratory distress.     Breath sounds: Normal breath sounds.  Musculoskeletal:        General: No swelling.     Cervical back: Neck supple.  Skin:    General: Skin is warm and dry.     Capillary Refill: Capillary refill takes less than 2 seconds.  Neurological:     Mental Status: She is alert.  Psychiatric:        Mood and Affect: Mood normal.    No results found for this or any previous visit (from the past 24 hour(s)).  No results found.  Dental block performed with 3 cc of plain 1% lidocaine and 0.5% Marcaine plain 3 cc. Pt tolerated with some relief of pain  Assessment and Plan :   No diagnosis found.  No orders of the defined types were placed in this encounter.   MDM:  MEGHANNE PLETZ is a 41 y.o. female presenting for dental abscess left lower jaw.  There is no evidence of facial cellulitis on exam there was abscess of the gumline laterally to the posterior molars  left lower jaw.  Patient's vital signs were unremarkable no evidence of sepsis.  Patient was given a dental block inferior orbital and sent home with Norco with instructions to take Motrin and/or Tylenol as needed for overall pain control.  She is encouraged to follow-up with dentist as soon as possible she will return if worse or new symptoms for reevaluation.  I discussed treatment, follow up and return instructions. Questions were answered. Patient/representative stated understanding of instructions and patient is stable for discharge. Dewaine Conger FNP-C MCN    Jone Baseman, NP 07/06/21 1258

## 2021-07-06 NOTE — Discharge Instructions (Addendum)
Use Norco and or extra strength Tylenol for pain control.  Start antibiotic as directed.  Follow-up with dentist as soon as possible.  You were given a dental block today which may or may not be helpful given the amount of infection near the nerve.

## 2021-07-06 NOTE — ED Triage Notes (Signed)
Patient presents to Urgent Care with complaints of dental pain on L side since Friday. Patient reports no dentist and is not sure last visit to dentist. Pt reports chip on a tooth on the side that is causing her issues. Pt . Reports 8/10 pain and swelling on l side today

## 2021-09-16 ENCOUNTER — Encounter (HOSPITAL_COMMUNITY): Payer: Self-pay

## 2021-09-16 ENCOUNTER — Ambulatory Visit (HOSPITAL_COMMUNITY)
Admission: EM | Admit: 2021-09-16 | Discharge: 2021-09-16 | Disposition: A | Discharge status: Home / Self Care | Payer: Commercial Managed Care - HMO | Attending: Emergency Medicine | Admitting: Emergency Medicine

## 2021-09-16 DIAGNOSIS — N898 Other specified noninflammatory disorders of vagina: Secondary | ICD-10-CM

## 2021-09-16 LAB — POCT URINALYSIS DIPSTICK, ED / UC
Bilirubin Urine: NEGATIVE
Bilirubin Urine: NEGATIVE
Glucose, UA: NEGATIVE mg/dL
Glucose, UA: NEGATIVE mg/dL
Hgb urine dipstick: NEGATIVE
Hgb urine dipstick: NEGATIVE
Ketones, ur: NEGATIVE mg/dL
Ketones, ur: NEGATIVE mg/dL
Nitrite: NEGATIVE
Nitrite: NEGATIVE
Protein, ur: NEGATIVE mg/dL
Protein, ur: NEGATIVE mg/dL
Specific Gravity, Urine: 1.025 (ref 1.005–1.030)
Specific Gravity, Urine: 1.025 (ref 1.005–1.030)
Urobilinogen, UA: 0.2 mg/dL (ref 0.0–1.0)
Urobilinogen, UA: 0.2 mg/dL (ref 0.0–1.0)
pH: 5.5 (ref 5.0–8.0)
pH: 5.5 (ref 5.0–8.0)

## 2021-09-16 LAB — POC URINE PREG, ED: Preg Test, Ur: NEGATIVE

## 2021-09-16 LAB — HIV ANTIBODY (ROUTINE TESTING W REFLEX): HIV Screen 4th Generation wRfx: NONREACTIVE

## 2021-09-16 NOTE — Discharge Instructions (Addendum)
May use topical hydrocortisone or lidocaine cream externally for comfort   May use over the counter pyridium to help with urinary frequency or urgency  Urinalysis is negative for a urinary/bladder infection   Labs pending 2-3 days, you will be contacted if positive for any sti and treatment will be sent to the pharmacy, you will have to return to the clinic if positive for gonorrhea to receive treatment   Please refrain from having sex until labs results, if positive please refrain from having sex until treatment complete and symptoms resolve   If positive for HIV, Syphilis, Chlamydia  gonorrhea or trichomoniasis please notify partner or partners so they may tested as well  Moving forward, it is recommended you use some form of protection against the transmission of sti infections  such as condoms or dental dams with each sexual encounter
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
## 2021-09-16 NOTE — ED Triage Notes (Signed)
Patient having urinary symptoms and vaginal itching for 7 days.  Patient having low back pain/abdominal pain, strong odor to the urine, dark urine, and frequency.

## 2021-09-17 ENCOUNTER — Telehealth (HOSPITAL_COMMUNITY): Payer: Self-pay | Admitting: Emergency Medicine

## 2021-09-17 LAB — CERVICOVAGINAL ANCILLARY ONLY
Bacterial Vaginitis (gardnerella): POSITIVE — AB
Candida Glabrata: NEGATIVE
Candida Vaginitis: POSITIVE — AB
Chlamydia: NEGATIVE
Comment: NEGATIVE
Comment: NEGATIVE
Comment: NEGATIVE
Comment: NEGATIVE
Comment: NEGATIVE
Comment: NORMAL
Neisseria Gonorrhea: NEGATIVE
Trichomonas: POSITIVE — AB

## 2021-09-17 LAB — RPR: RPR Ser Ql: NONREACTIVE

## 2021-09-17 MED ORDER — METRONIDAZOLE 500 MG PO TABS: 500.0000 mg | ORAL_TABLET | Freq: Two times a day (BID) | ORAL | 0 refills | Status: DC

## 2021-09-17 MED ORDER — FLUCONAZOLE 150 MG PO TABS: 150.0000 mg | ORAL_TABLET | Freq: Once | ORAL | 0 refills | Status: AC

## 2021-11-19 ENCOUNTER — Encounter (HOSPITAL_COMMUNITY): Payer: Self-pay

## 2021-11-19 ENCOUNTER — Ambulatory Visit (HOSPITAL_COMMUNITY)
Admission: RE | Admit: 2021-11-19 | Discharge: 2021-11-19 | Disposition: A | Discharge status: Home / Self Care | Payer: Commercial Managed Care - HMO | Source: Ambulatory Visit | Attending: Physician Assistant | Admitting: Physician Assistant

## 2021-11-19 VITALS — BP 134/108 | HR 106 | Temp 98.7°F | Resp 17

## 2021-11-19 DIAGNOSIS — N898 Other specified noninflammatory disorders of vagina: Secondary | ICD-10-CM | POA: Insufficient documentation

## 2021-11-19 DIAGNOSIS — N760 Acute vaginitis: Secondary | ICD-10-CM

## 2021-11-19 LAB — POCT URINALYSIS DIPSTICK, ED / UC
Bilirubin Urine: NEGATIVE
Glucose, UA: NEGATIVE mg/dL
Hgb urine dipstick: NEGATIVE
Ketones, ur: NEGATIVE mg/dL
Leukocytes,Ua: NEGATIVE
Nitrite: NEGATIVE
Protein, ur: NEGATIVE mg/dL
Specific Gravity, Urine: 1.015 (ref 1.005–1.030)
Urobilinogen, UA: 0.2 mg/dL (ref 0.0–1.0)
pH: 7 (ref 5.0–8.0)

## 2021-11-19 LAB — POC URINE PREG, ED: Preg Test, Ur: NEGATIVE

## 2021-11-19 MED ORDER — FLUCONAZOLE 150 MG PO TABS: 150.0000 mg | ORAL_TABLET | ORAL | 0 refills | Status: AC

## 2021-11-19 NOTE — Discharge Instructions (Signed)
Your urine pregnancy test was negative.  Your UA looked normal with no evidence of infection.  We are treating you for a yeast infection.  Please take Diflucan tonight and then take a second dose in 1 week if symptoms have not resolved.  We will contact you if we need to arrange additional treatment based on your swab.  Wear loosefitting cotton underwear and use hypoallergenic soaps and detergents.  If you develop any abdominal pain, pelvic pain, fever, nausea, vomiting please be seen immediately.

## 2021-11-19 NOTE — ED Provider Notes (Signed)
[facility]    CSN: [id number] Arrival date & time: 11/19/21  [place]      History   Chief Complaint Chief Complaint  Patient presents with   Vaginal Itching    Entered by patient   Vaginal Discharge    HPI Virginia Ferguson is a 41 y.o. female.   Patient presents today with a 2 to 3-day history of vaginal irritation.  She reports a burning/stinging sensation in the vagina.  She does report some white vaginal discharge but says it is not thick and clumpy but rather thin.  She denies any changes to personal hygiene products including soaps or detergents.  She denies any urinary symptoms including frequency, urgency, hematuria.  She does not believe that she could be pregnant as her menstrual cycles have been normal with LMP 11/08/2021.  Denies any recent antibiotics.  She denies any abdominal pain, pelvic pain, fever, nausea, vomiting.    Past Medical History:  Diagnosis Date   History of kidney stones    Iron deficiency anemia    Medical history non-contributory     Patient Active Problem List   Diagnosis Date Noted   Left ureteral stone 12/23/2019   Left flank pain 12/22/2019   Left sided abdominal pain of unknown cause 12/22/2019    Past Surgical History:  Procedure Laterality Date   CYSTOSCOPY W/ URETERAL STENT PLACEMENT Left 12/23/2019   Procedure: CYSTOSCOPY WITH LEFT RETROGRADE PYELOGRAM/URETERAL STENT PLACEMENT X2;  Surgeon: Janith Lima, MD;  Location: [facility];  Service: Urology;  Laterality: Left;   CYSTOSCOPY/URETEROSCOPY/HOLMIUM LASER/STENT PLACEMENT Left 01/15/2020   Procedure: CYSTOSCOPY/RETROGRADE/URETEROSCOPY/HOLMIUM LASER/STENT PLACEMENT;  Surgeon: Janith Lima, MD;  Location: [facility];  Service: Urology;  Laterality: Left;  ONLY NEEDS 45 MIN   NO PAST SURGERIES      OB History     Gravida  1   Para  1   Term  1   Preterm      AB      Living  1      SAB      IAB      Ectopic      Multiple      Live Births                Home Medications    Prior to Admission medications   Medication Sig Start Date End Date Taking? Authorizing Provider  fluconazole (DIFLUCAN) 150 MG tablet Take 1 tablet (150 mg total) by mouth once a week for 2 doses. 11/19/21 11/27/21 Yes Rielle Schlauch K, PA-C  cyclobenzaprine (FLEXERIL) 5 MG tablet Take 1 tablet (5 mg total) by mouth 2 (two) times daily as needed for muscle spasms. 05/11/21   Piontek, Junie Panning, MD  HYDROcodone-acetaminophen (NORCO/VICODIN) 5-325 MG tablet Take 1-2 tablets by mouth every 4 (four) hours as needed. 07/06/21   Hezzie Bump, NP  ibuprofen (ADVIL) 800 MG tablet Take 1 tablet (800 mg total) by mouth 3 (three) times daily. 07/06/21   Hezzie Bump, NP  metroNIDAZOLE (FLAGYL) 500 MG tablet Take 1 tablet (500 mg total) by mouth 2 (two) times daily. 09/17/21   Lamptey, Myrene Galas, MD    Family History Family History  Problem Relation Age of Onset   Hypertension Mother    Diabetes Mother     Social History Social History   Tobacco Use   Smoking status: Every Day    Packs/day: 0.50    Types: Cigarettes   Smokeless tobacco: Never  Vaping Use   Vaping Use: Never used  Substance Use Topics   Alcohol use: Yes    Comment: occasional   Drug use: No     Allergies   Patient has no known allergies.   Review of Systems Review of Systems  Constitutional:  Positive for activity change. Negative for appetite change, fatigue and fever.  Gastrointestinal:  Negative for abdominal pain, diarrhea, nausea and vomiting.  Genitourinary:  Positive for vaginal discharge and vaginal pain. Negative for dysuria, frequency, pelvic pain, urgency and vaginal bleeding.     Physical Exam Triage Vital Signs ED Triage Vitals  Enc Vitals Group     BP 11/19/21 1859 (!) 134/108     Pulse Rate 11/19/21 1859 (!) 106     Resp 11/19/21 1859 17     Temp 11/19/21 1859 98.7 F (37.1 C)     Temp Source 11/19/21 1859 Oral     SpO2 11/19/21 1859 100 %     Weight --       Height --      Head Circumference --      Peak Flow --      Pain Score 11/19/21 1858 0     Pain Loc --      Pain Edu? --      Excl. in GC? --    No data found.  Updated Vital Signs BP (!) 134/108 (BP Location: Left Arm)   Pulse (!) 106   Temp 98.7 F (37.1 C) (Oral)   Resp 17   LMP 11/08/2021 (Exact Date)   SpO2 100%   Visual Acuity Right Eye Distance:   Left Eye Distance:   Bilateral Distance:    Right Eye Near:   Left Eye Near:    Bilateral Near:     Physical Exam Vitals reviewed.  Constitutional:      General: She is awake. She is not in acute distress.    Appearance: Normal appearance. She is well-developed. She is not ill-appearing.     Comments: Very pleasant female appears stated age in no acute distress sitting comfortably in exam room  HENT:     Head: Normocephalic and atraumatic.  Cardiovascular:     Rate and Rhythm: Normal rate and regular rhythm.     Heart sounds: Normal heart sounds, S1 normal and S2 normal. No murmur heard. Pulmonary:     Effort: Pulmonary effort is normal.     Breath sounds: Normal breath sounds. No wheezing, rhonchi or rales.     Comments: Clear to auscultation bilaterally Abdominal:     General: Bowel sounds are normal.     Palpations: Abdomen is soft.     Tenderness: There is no abdominal tenderness. There is no right CVA tenderness, left CVA tenderness, guarding or rebound.     Comments: Benign abdominal exam  Genitourinary:    Comments: Exam deferred Psychiatric:        Behavior: Behavior is cooperative.      UC Treatments / Results  Labs (all labs ordered are listed, but only abnormal results are displayed) Labs Reviewed  POCT URINALYSIS DIPSTICK, ED / UC  POC URINE PREG, ED  CERVICOVAGINAL ANCILLARY ONLY    EKG   Radiology No results found.  Procedures Procedures (including critical care time)  Medications Ordered in UC Medications - No data to display  Initial Impression / Assessment and Plan / UC  Course  I have reviewed the triage vital signs and the nursing notes.  Pertinent labs & imaging  results that were available during my care of the patient were reviewed by me and considered in my medical decision making (see chart for details).     Urine pregnancy was negative in clinic today.  UA was normal.  Suspect vaginal yeast infection as etiology of symptoms.  Patient was started on Diflucan weekly for up to 2 doses.  STI swab was collected today by patient and we will contact her if we need to arrange additional treatment.  Recommended hypoallergenic soaps and detergents.  She is to wear loosefitting cotton underwear.  Discussed that if she develops any additional symptoms including pelvic pain, abdominal pain, fever, nausea, vomiting she needs to be seen immediately.  Recommended follow-up with OB/GYN if symptoms do not improve quickly.  She was provided contact information for local provider with instruction to call to schedule an appointment.  Strict return precautions given.  Final Clinical Impressions(s) / UC Diagnoses   Final diagnoses:  Acute vaginitis  Vaginal irritation     Discharge Instructions      Your urine pregnancy test was negative.  Your UA looked normal with no evidence of infection.  We are treating you for a yeast infection.  Please take Diflucan tonight and then take a second dose in 1 week if symptoms have not resolved.  We will contact you if we need to arrange additional treatment based on your swab.  Wear loosefitting cotton underwear and use hypoallergenic soaps and detergents.  If you develop any abdominal pain, pelvic pain, fever, nausea, vomiting please be seen immediately.     ED Prescriptions     Medication Sig Dispense Auth. Provider   fluconazole (DIFLUCAN) 150 MG tablet Take 1 tablet (150 mg total) by mouth once a week for 2 doses. 2 tablet Ever Gustafson, Noberto Retort, PA-C      PDMP not reviewed this encounter.   Jeani Hawking, PA-C 11/19/21 1948

## 2021-11-19 NOTE — ED Triage Notes (Signed)
Pt reports vaginal discharge and itching x 2/3 days.

## 2021-11-20 ENCOUNTER — Telehealth (HOSPITAL_COMMUNITY): Payer: Self-pay | Admitting: Emergency Medicine

## 2021-11-20 LAB — CERVICOVAGINAL ANCILLARY ONLY
Bacterial Vaginitis (gardnerella): POSITIVE — AB
Candida Glabrata: NEGATIVE
Candida Vaginitis: POSITIVE — AB
Chlamydia: NEGATIVE
Comment: NEGATIVE
Comment: NEGATIVE
Comment: NEGATIVE
Comment: NEGATIVE
Comment: NEGATIVE
Comment: NORMAL
Neisseria Gonorrhea: NEGATIVE
Trichomonas: NEGATIVE

## 2021-11-20 MED ORDER — METRONIDAZOLE 500 MG PO TABS: 500.0000 mg | ORAL_TABLET | Freq: Two times a day (BID) | ORAL | 0 refills | Status: DC

## 2022-04-29 ENCOUNTER — Encounter (HOSPITAL_COMMUNITY): Payer: Self-pay

## 2022-04-29 ENCOUNTER — Other Ambulatory Visit: Payer: Self-pay

## 2022-04-29 ENCOUNTER — Emergency Department (HOSPITAL_COMMUNITY)
Admission: EM | Admit: 2022-04-29 | Discharge: 2022-04-29 | Disposition: A | Discharge status: Home / Self Care | Payer: Medicaid Other | Attending: Student | Admitting: Student

## 2022-04-29 DIAGNOSIS — R42 Dizziness and giddiness: Secondary | ICD-10-CM | POA: Diagnosis present

## 2022-04-29 DIAGNOSIS — D649 Anemia, unspecified: Secondary | ICD-10-CM | POA: Diagnosis not present

## 2022-04-29 DIAGNOSIS — R519 Headache, unspecified: Secondary | ICD-10-CM

## 2022-04-29 DIAGNOSIS — E876 Hypokalemia: Secondary | ICD-10-CM | POA: Insufficient documentation

## 2022-04-29 DIAGNOSIS — D509 Iron deficiency anemia, unspecified: Secondary | ICD-10-CM

## 2022-04-29 LAB — CBC WITH DIFFERENTIAL/PLATELET
Abs Immature Granulocytes: 0.01 10*3/uL (ref 0.00–0.07)
Basophils Absolute: 0 10*3/uL (ref 0.0–0.1)
Basophils Relative: 1 %
Eosinophils Absolute: 0.2 10*3/uL (ref 0.0–0.5)
Eosinophils Relative: 3 %
HCT: 31.4 % — ABNORMAL LOW (ref 36.0–46.0)
Hemoglobin: 9 g/dL — ABNORMAL LOW (ref 12.0–15.0)
Immature Granulocytes: 0 %
Lymphocytes Relative: 29 %
Lymphs Abs: 2 10*3/uL (ref 0.7–4.0)
MCH: 21 pg — ABNORMAL LOW (ref 26.0–34.0)
MCHC: 28.7 g/dL — ABNORMAL LOW (ref 30.0–36.0)
MCV: 73.4 fL — ABNORMAL LOW (ref 80.0–100.0)
Monocytes Absolute: 0.4 10*3/uL (ref 0.1–1.0)
Monocytes Relative: 6 %
Neutro Abs: 4.2 10*3/uL (ref 1.7–7.7)
Neutrophils Relative %: 61 %
Platelets: 319 10*3/uL (ref 150–400)
RBC: 4.28 MIL/uL (ref 3.87–5.11)
RDW: 21.5 % — ABNORMAL HIGH (ref 11.5–15.5)
WBC: 6.8 10*3/uL (ref 4.0–10.5)
nRBC: 0 % (ref 0.0–0.2)

## 2022-04-29 LAB — BASIC METABOLIC PANEL
Anion gap: 11 (ref 5–15)
BUN: <5 mg/dL — ABNORMAL LOW (ref 6–20)
CO2: 18 mmol/L — ABNORMAL LOW (ref 22–32)
Calcium: 9.3 mg/dL (ref 8.9–10.3)
Chloride: 108 mmol/L (ref 98–111)
Creatinine, Ser: 0.63 mg/dL (ref 0.44–1.00)
GFR, Estimated: >60 mL/min (ref >60)
Glucose, Bld: 95 mg/dL (ref 70–99)
Potassium: 3.2 mmol/L — ABNORMAL LOW (ref 3.5–5.1)
Sodium: 137 mmol/L (ref 135–145)

## 2022-04-29 LAB — I-STAT BETA HCG BLOOD, ED (MC, WL, AP ONLY): I-stat hCG, quantitative: <5 m[IU]/mL (ref <5)

## 2022-04-29 MED ORDER — DIPHENHYDRAMINE HCL 50 MG/ML IJ SOLN
25.0000 mg | Freq: Once | INTRAMUSCULAR | Status: AC
  Administered 2022-04-29: 25 mg via INTRAVENOUS
  Filled 2022-04-29: qty 1

## 2022-04-29 MED ORDER — SODIUM CHLORIDE 0.9 % IV BOLUS
500.0000 mL | Freq: Once | INTRAVENOUS | Status: AC
  Administered 2022-04-29: 500 mL via INTRAVENOUS

## 2022-04-29 MED ORDER — POTASSIUM CHLORIDE CRYS ER 20 MEQ PO TBCR
40.0000 meq | EXTENDED_RELEASE_TABLET | Freq: Once | ORAL | Status: AC
  Administered 2022-04-29: 40 meq via ORAL
  Filled 2022-04-29: qty 2

## 2022-04-29 MED ORDER — METOCLOPRAMIDE HCL 5 MG/ML IJ SOLN
10.0000 mg | Freq: Once | INTRAMUSCULAR | Status: AC
  Administered 2022-04-29: 10 mg via INTRAVENOUS
  Filled 2022-04-29: qty 2

## 2022-04-29 MED ORDER — METOCLOPRAMIDE HCL 10 MG PO TABS: 10.0000 mg | ORAL_TABLET | Freq: Four times a day (QID) | ORAL | 0 refills | Status: DC

## 2022-04-29 MED ORDER — MECLIZINE HCL 25 MG PO TABS: 25.0000 mg | ORAL_TABLET | Freq: Three times a day (TID) | ORAL | 0 refills | Status: DC | PRN

## 2022-04-29 NOTE — Discharge Instructions (Signed)
Please read and follow all provided instructions.  Your diagnoses today include:  1. Vertigo   2. Acute nonintractable headache, unspecified headache type   3. Hypokalemia   4. Microcytic anemia     Tests performed today include: Complete blood cell count: There are signs of iron deficiency anemia, chronic Basic metabolic panel: Low potassium Pregnancy test (urine or blood, in women only): Negative Vital signs. See below for your results today.   Medications:  In the Emergency Department you received: Reglan - antinausea/headache medication Benadryl - antihistamine to counteract potential side effects of reglan  Take any prescribed medications only as directed.  Additional information:  Follow any educational materials contained in this packet.  You are having a headache. No specific cause was found today for your headache. It may have been a migraine or other cause of headache. Stress, anxiety, fatigue, and depression are common triggers for headaches.   Your headache today does not appear to be life-threatening or require hospitalization, but often the exact cause of headaches is not determined in the emergency department. Therefore, follow-up with your doctor is very important to find out what may have caused your headache and whether or not you need any further diagnostic testing or treatment.   Sometimes headaches can appear benign (not harmful), but then more serious symptoms can develop which should prompt an immediate re-evaluation by your doctor or the emergency department.  BE VERY CAREFUL not to take multiple medicines containing Tylenol (also called acetaminophen). Doing so can lead to an overdose which can damage your liver and cause liver failure and possibly death.   Follow-up instructions: Please follow-up with your primary care provider in the next 3 days for further evaluation of your symptoms.   Return instructions:  Please return to the Emergency Department if  you experience worsening symptoms. Return if the medications do not resolve your headache, if it recurs, or if you have multiple episodes of vomiting or cannot keep down fluids. Return if you have a change from the usual headache. RETURN IMMEDIATELY IF you: Develop a sudden, severe headache Develop confusion or become poorly responsive or faint Develop a fever above 100.48F or problem breathing Have a change in speech, vision, swallowing, or understanding Develop new weakness, numbness, tingling, incoordination in your arms or legs Have a seizure Please return if you have any other emergent concerns.  Additional Information:  Your vital signs today were: BP 122/68 (BP Location: Right Arm)   Pulse (!) 107   Temp 98.5 F (36.9 C) (Oral)   Resp 16   Ht 5\' 4"  (1.626 m)   Wt 123 kg   SpO2 100%   BMI 46.55 kg/m  If your blood pressure (BP) was elevated above 135/85 this visit, please have this repeated by your doctor within one month. --------------

## 2022-04-29 NOTE — ED Triage Notes (Signed)
Pt to the ed from home via ems  with a CC of dizziness x 20 min. Pt relay she was in bed relaxing when dizziness started. Pt relays a hx of vertigo. Pt denies current nausea or vomiting. Pt denies cp, sob, dizziness, or any other s/s at this time.

## 2022-04-29 NOTE — ED Provider Notes (Signed)
[facility] Provider Note   CSN: [id number] Arrival date & time: 04/29/22  1720     History  Chief Complaint  Patient presents with   Dizziness    Virginia Ferguson is a 42 y.o. female.  Patient presents to the department today for evaluation of vertigo symptoms.  Symptoms started a couple of hours ago.  Virginia Ferguson reports a spinning sensation with associated nausea.  After the symptoms began, Virginia Ferguson developed a headache.  This is frontal.  Virginia Ferguson ranks as 9 out of 10.  Virginia Ferguson has associated light sensitivity.  No confusion, fever, neck pain.  Virginia Ferguson denies any falls or injuries.  Virginia Ferguson states that Virginia Ferguson has had vertigo in the past, but "a long time ago".  No chest pain, shortness of breath.  No treatments prior to arrival.  Transported by EMS.       Home Medications Prior to Admission medications   Medication Sig Start Date End Date Taking? Authorizing Provider  cyclobenzaprine (FLEXERIL) 5 MG tablet Take 1 tablet (5 mg total) by mouth 2 (two) times daily as needed for muscle spasms. 05/11/21   Piontek, Junie Panning, MD  HYDROcodone-acetaminophen (NORCO/VICODIN) 5-325 MG tablet Take 1-2 tablets by mouth every 4 (four) hours as needed. 07/06/21   Hezzie Bump, NP  ibuprofen (ADVIL) 800 MG tablet Take 1 tablet (800 mg total) by mouth 3 (three) times daily. 07/06/21   Hezzie Bump, NP  metroNIDAZOLE (FLAGYL) 500 MG tablet Take 1 tablet (500 mg total) by mouth 2 (two) times daily. 11/20/21   Lamptey, Myrene Galas, MD      Allergies    Patient has no known allergies.    Review of Systems   Review of Systems  Physical Exam Updated Vital Signs BP 126/70 (BP Location: Right Arm)   Pulse 94   Temp 98.6 F (37 C) (Oral)   Resp 18   Ht 5\' 4"  (1.626 m)   Wt 123 kg   SpO2 96%   BMI 46.55 kg/m  Physical Exam Vitals and nursing note reviewed.  Constitutional:      Appearance: Virginia Ferguson is well-developed.     Comments: Patient lying in bed, covering her eyes with her left hand  and wrist.  Appears to be in mild distress.  HENT:     Head: Normocephalic and atraumatic.     Right Ear: Tympanic membrane, ear canal and external ear normal.     Left Ear: Tympanic membrane, ear canal and external ear normal.     Nose: Nose normal.     Mouth/Throat:     Pharynx: Uvula midline.  Eyes:     General: Lids are normal.     Extraocular Movements:     Right eye: No nystagmus.     Left eye: No nystagmus.     Conjunctiva/sclera: Conjunctivae normal.     Pupils: Pupils are equal, round, and reactive to light.     Comments: Patient is squinting and opens her eyes only enough for me to visualize her pupils.  Virginia Ferguson is unable to cooperate enough for me to determine if Virginia Ferguson has significant nystagmus at time of initial exam.  Cardiovascular:     Rate and Rhythm: Normal rate and regular rhythm.  Pulmonary:     Effort: Pulmonary effort is normal.     Breath sounds: Normal breath sounds.  Abdominal:     Palpations: Abdomen is soft.     Tenderness: There is no abdominal tenderness. There is  no guarding or rebound.  Musculoskeletal:     Cervical back: Normal range of motion and neck supple. No tenderness or bony tenderness.  Skin:    General: Skin is warm and dry.  Neurological:     Mental Status: Virginia Ferguson is alert and oriented to person, place, and time.     GCS: GCS eye subscore is 4. GCS verbal subscore is 5. GCS motor subscore is 6.     Comments: Patient is uncomfortable and unwilling to cooperate with detailed neuroexam.  Virginia Ferguson does wiggle fingers and toes without any difficulty.  Speech appears normal.     ED Results / Procedures / Treatments   Labs (all labs ordered are listed, but only abnormal results are displayed) Labs Reviewed  CBC WITH DIFFERENTIAL/PLATELET - Abnormal; Notable for the following components:      Result Value   Hemoglobin 9.0 (*)    HCT 31.4 (*)    MCV 73.4 (*)    MCH 21.0 (*)    MCHC 28.7 (*)    RDW 21.5 (*)    All other components within normal limits   BASIC METABOLIC PANEL - Abnormal; Notable for the following components:   Potassium 3.2 (*)    CO2 18 (*)    BUN <5 (*)    All other components within normal limits  I-STAT BETA HCG BLOOD, ED ([facility], [facility], [facility] ONLY)    ED ECG REPORT   Date: 04/29/2022  Rate: 97  Rhythm: normal sinus rhythm  QRS Axis: normal  Intervals: normal  ST/T Wave abnormalities: normal  Conduction Disutrbances:none  Narrative Interpretation:   Old EKG Reviewed: unchanged  I have personally reviewed the EKG tracing and agree with the computerized printout as noted.   Radiology No results found.  Procedures Procedures    Medications Ordered in ED Medications - No data to display  ED Course/ Medical Decision Making/ A&P    Patient seen and examined. History obtained directly from patient.   Labs/EKG: Ordered EKG, CBC, BMP, pregnancy.  Imaging: None ordered  Medications/Fluids: Ordered: IV Reglan, Benadryl.   Most recent vital signs reviewed and are as follows: BP 126/70 (BP Location: Right Arm)   Pulse 94   Temp 98.6 F (37 C) (Oral)   Resp 18   Ht 5\' 4"  (1.626 m)   Wt 123 kg   SpO2 96%   BMI 46.55 kg/m   Initial impression: Vertigo  8:22 PM Reassessment performed. Patient appears improved.  Virginia Ferguson has been up walking in the halls.  Virginia Ferguson has a mild frontal headache otherwise headache is improved.  Vertigo is nearly resolved.  No significant nystagmus on exam.  Normal gross movement and coordination.  Labs personally reviewed and interpreted including: CBC with differential shows hemoglobin 9.0, microcytic, chronic; BMP with mildly low potassium at 3.2 repleted otherwise unremarkable; pregnancy negative.  EKG reviewed and interpreted as above.  Reviewed pertinent lab work and imaging with patient at bedside. Questions answered.   Most current vital signs reviewed and are as follows: BP 122/68 (BP Location: Right Arm)   Pulse (!) 107   Temp 98.5 F (36.9 C) (Oral)   Resp 16   Ht 5\' 4"   (1.626 m)   Wt 123 kg   SpO2 100%   BMI 46.55 kg/m   Plan: Discharge to home.   Prescriptions written for: Meclizine, p.o. Reglan  Other home care instructions discussed: Rest, good hydration  ED return instructions discussed: Patient counseled to return if they have weakness in their  arms or legs, slurred speech, trouble walking or talking, confusion, trouble with their balance, or if they have any other concerns. Patient verbalizes understanding and agrees with plan.   Follow-up instructions discussed: Patient encouraged to follow-up with their PCP in 3 days if not improving. Also for discussion of anemia treatment.                             Medical Decision Making Amount and/or Complexity of Data Reviewed Labs: ordered.  Risk Prescription drug management.   Patient presents for headache and suspected peripheral vertigo, worse with movement.  This was treated improved in the emergency room.  No other focal neuro deficits to suggest central etiology.  In regards to the patient's headache, critical differentials were considered including subarachnoid hemorrhage, intracerebral hemorrhage, epidural/subdural hematoma, pituitary apoplexy, vertebral/carotid artery dissection, giant cell arteritis, central venous thrombosis, reversible cerebral vasoconstriction, acute angle closure glaucoma, idiopathic intracranial hypertension, bacterial meningitis, viral encephalitis, carbon monoxide poisoning, posterior reversible encephalopathy syndrome, pre-eclampsia.   Reg flag symptoms related to these causes were considered including systemic symptoms (fever, weight loss), neurologic symptoms (confusion, mental status change, vision change, associated seizure), acute or sudden "thunderclap" onset, patient age 17 or older with new or progressive headache, patient of any age with first headache or change in headache pattern, pregnant or postpartum status, history of HIV or other immunocompromise,  history of cancer, headache occurring with exertion, associated neck or shoulder pain, associated traumatic injury, concurrent use of anticoagulation, family history of spontaneous SAH, and concurrent drug use.    Other benign, more common causes of headache were considered including migraine, tension-type headache, cluster headache, referred pain from other cause such as sinus infection, dental pain, trigeminal neuralgia.   On exam, patient has a reassuring neuro exam including baseline mental status, no significant neck pain or meningeal signs, no signs of severe infection or fever.   The patient's vital signs, pertinent lab work and imaging were reviewed and interpreted as discussed in the ED course. Hospitalization was considered for further testing, treatments, or serial exams/observation. However as patient is well-appearing, has a stable exam over the course of their evaluation, and reassuring studies today, I do not feel that they warrant admission at this time. This plan was discussed with the patient who verbalizes agreement and comfort with this plan and seems reliable and able to return to the Emergency Department with worsening or changing symptoms.   Microcytic anemia: Chronic.  Not currently on iron supplementation.  Hypokalemia: Mild, no EKG changes.         Final Clinical Impression(s) / ED Diagnoses Final diagnoses:  Vertigo  Acute nonintractable headache, unspecified headache type  Hypokalemia  Microcytic anemia    Rx / DC Orders ED Discharge Orders          Ordered    meclizine (ANTIVERT) 25 MG tablet  3 times daily PRN        04/29/22 2019    metoCLOPramide (REGLAN) 10 MG tablet  Every 6 hours        04/29/22 2019              Carlisle Cater, PA-C 04/29/22 2025    Teressa Lower, MD 04/30/22 1228

## 2022-07-22 IMAGING — CR DG FOREARM 2V*R*
2 series · 2 of 2 positions shown · non-contrast
Comparison: None.

CLINICAL DATA: Trauma, pain

EXAM:
RIGHT FOREARM - 2 VIEW

[x forearm ap right]
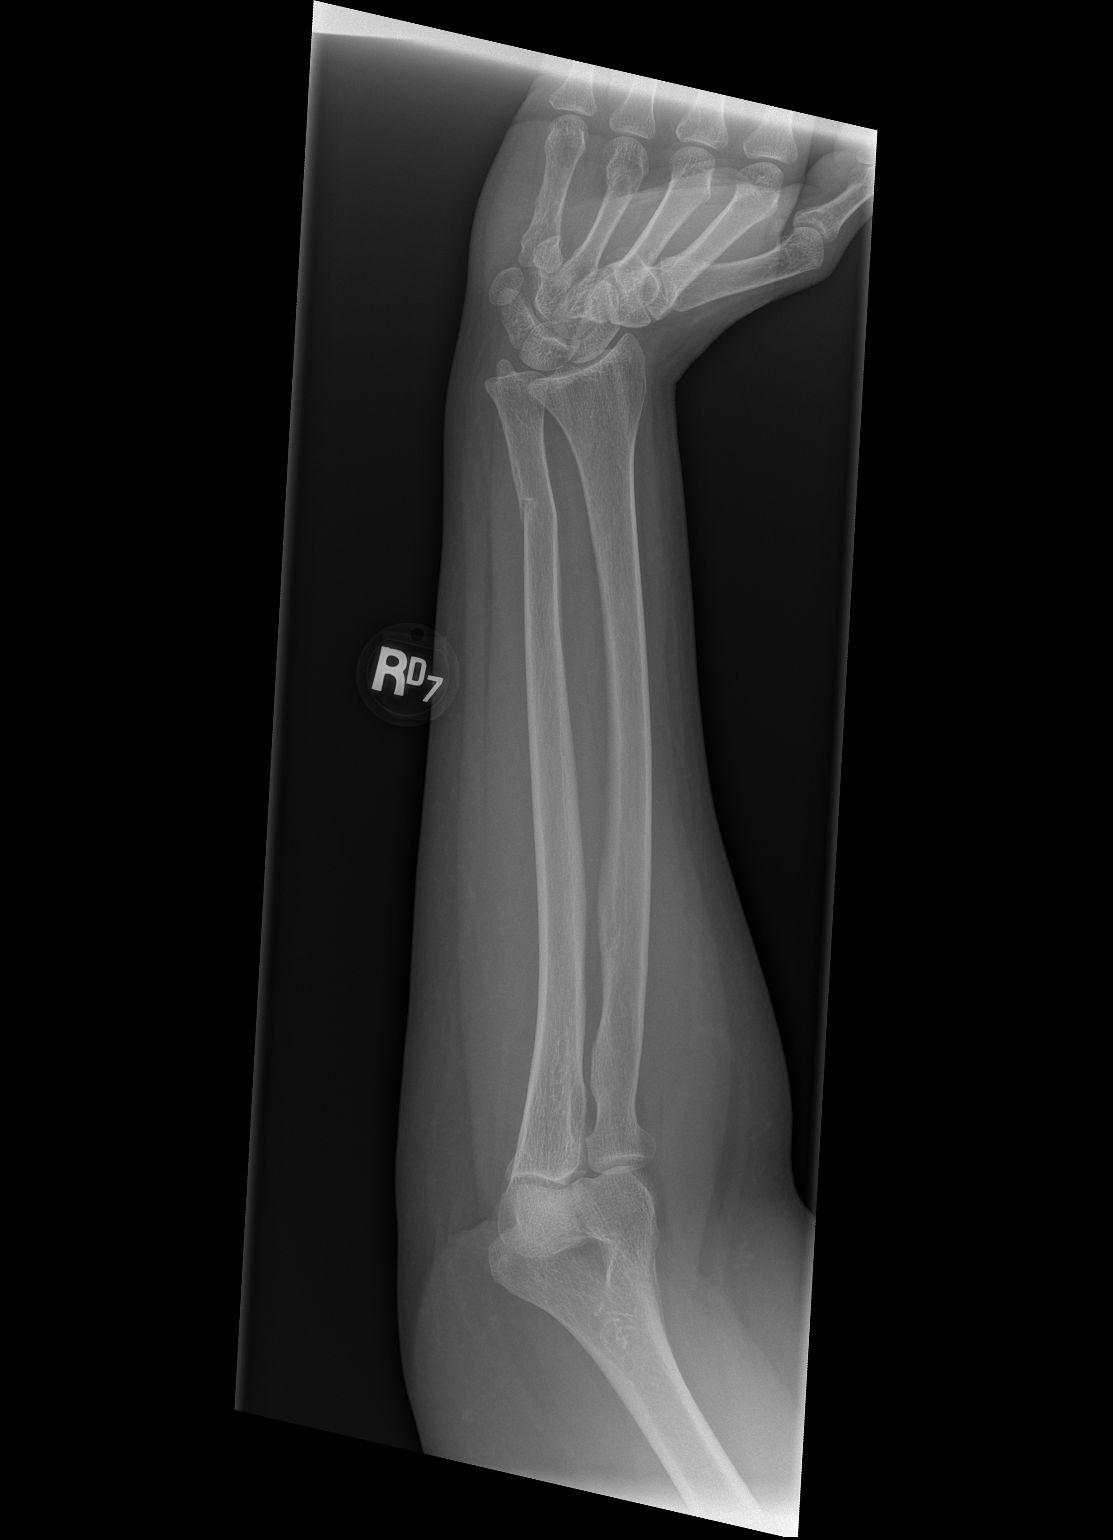

[x forearm lat right]
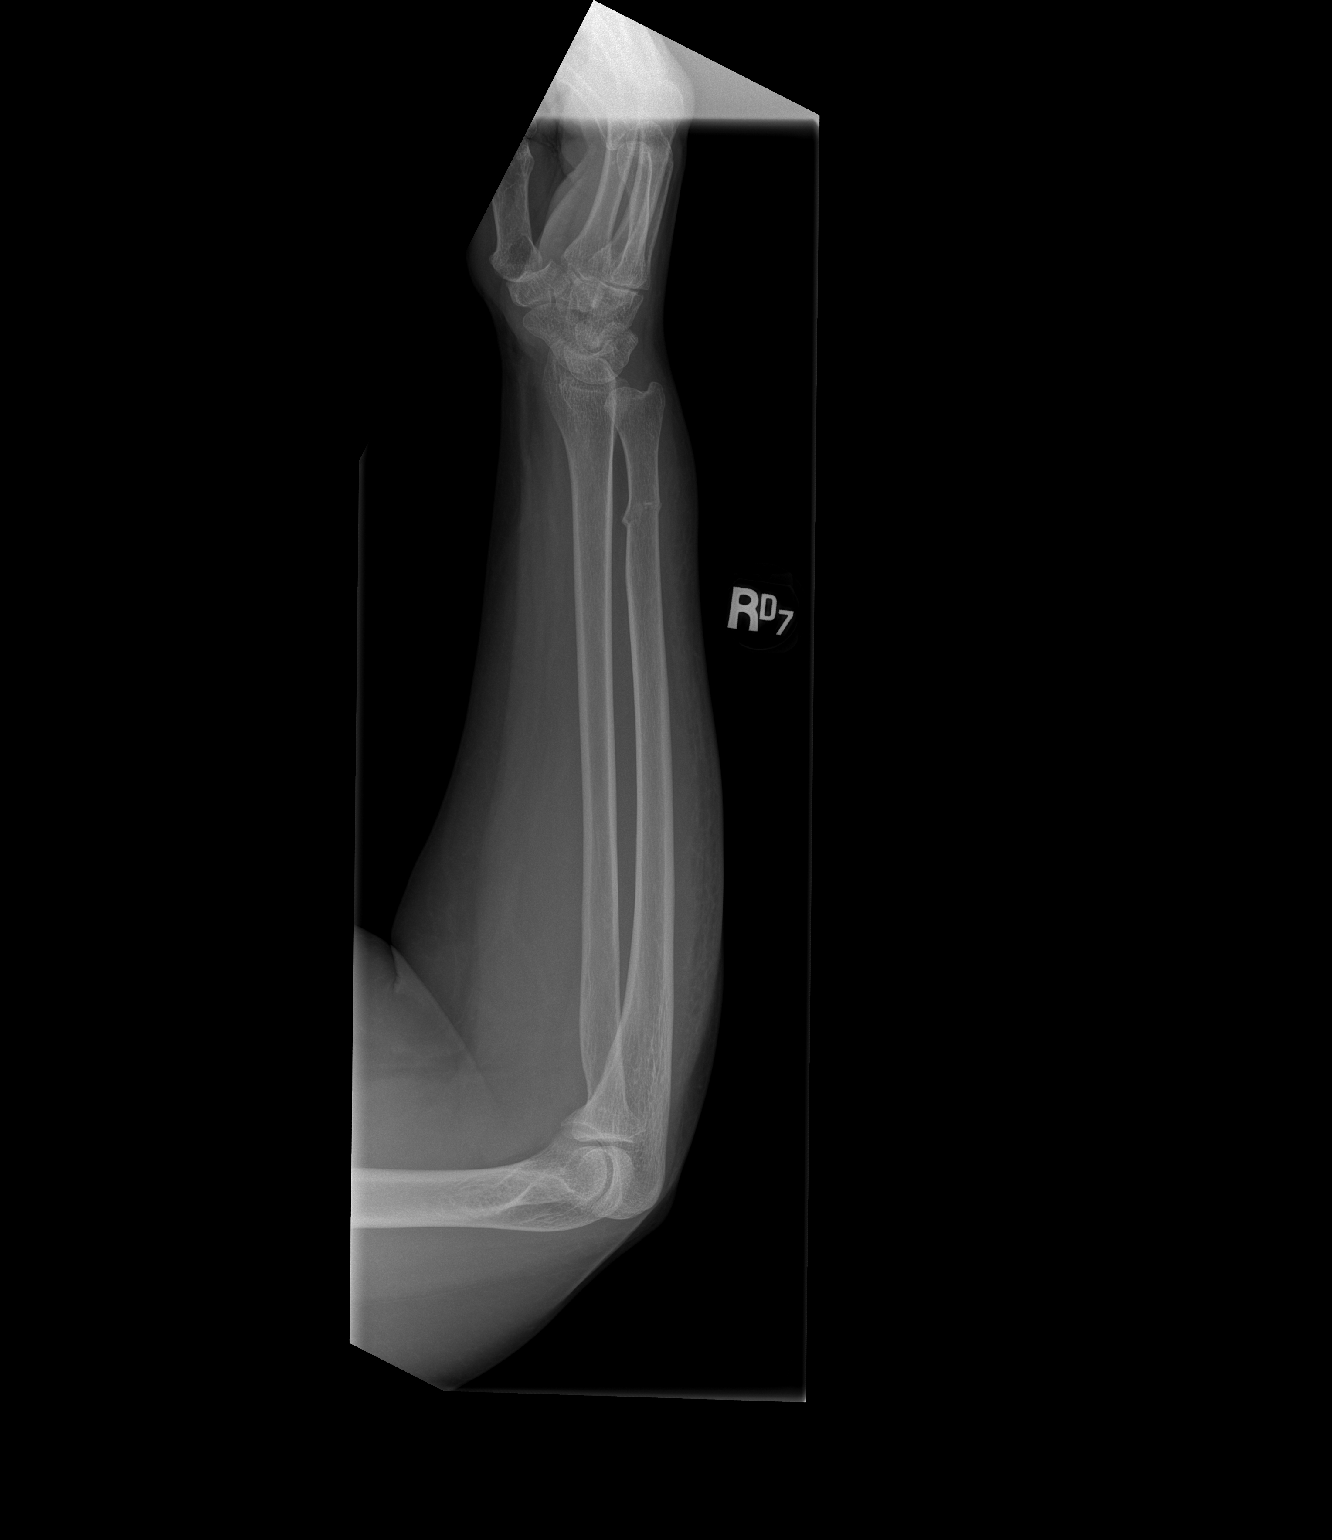

[2 of 2 positions shown; findings below may reference images not displayed]

FINDINGS: There is minimally angulated fracture in the distal shaft of ulna.
Rest of the bony structures are unremarkable.
IMPRESSION: Essentially undisplaced, minimally angulated fracture is seen in the
distal shaft of right ulna.

## 2022-10-08 DIAGNOSIS — M4316 Spondylolisthesis, lumbar region: Secondary | ICD-10-CM | POA: Diagnosis not present

## 2022-10-08 DIAGNOSIS — M5116 Intervertebral disc disorders with radiculopathy, lumbar region: Secondary | ICD-10-CM | POA: Diagnosis not present

## 2022-10-08 DIAGNOSIS — M4726 Other spondylosis with radiculopathy, lumbar region: Secondary | ICD-10-CM | POA: Diagnosis not present

## 2023-04-27 DIAGNOSIS — N12 Tubulo-interstitial nephritis, not specified as acute or chronic: Secondary | ICD-10-CM | POA: Diagnosis not present

## 2023-04-27 DIAGNOSIS — R519 Headache, unspecified: Secondary | ICD-10-CM | POA: Insufficient documentation

## 2023-04-27 DIAGNOSIS — Z7982 Long term (current) use of aspirin: Secondary | ICD-10-CM | POA: Insufficient documentation

## 2023-04-27 DIAGNOSIS — R109 Unspecified abdominal pain: Secondary | ICD-10-CM | POA: Diagnosis present

## 2023-04-27 DIAGNOSIS — E876 Hypokalemia: Secondary | ICD-10-CM | POA: Insufficient documentation

## 2023-04-27 DIAGNOSIS — R030 Elevated blood-pressure reading, without diagnosis of hypertension: Secondary | ICD-10-CM | POA: Diagnosis not present

## 2023-04-28 ENCOUNTER — Emergency Department (HOSPITAL_COMMUNITY)

## 2023-04-28 ENCOUNTER — Emergency Department (HOSPITAL_COMMUNITY)
Admission: EM | Admit: 2023-04-28 | Discharge: 2023-04-28 | Disposition: A | Discharge status: Home / Self Care | Attending: Emergency Medicine | Admitting: Emergency Medicine

## 2023-04-28 DIAGNOSIS — R519 Headache, unspecified: Secondary | ICD-10-CM

## 2023-04-28 DIAGNOSIS — E876 Hypokalemia: Secondary | ICD-10-CM

## 2023-04-28 DIAGNOSIS — N12 Tubulo-interstitial nephritis, not specified as acute or chronic: Secondary | ICD-10-CM

## 2023-04-28 LAB — COMPREHENSIVE METABOLIC PANEL
ALT: 13 U/L (ref 0–44)
AST: 19 U/L (ref 15–41)
Albumin: 3.9 g/dL (ref 3.5–5.0)
Alkaline Phosphatase: 138 U/L — ABNORMAL HIGH (ref 38–126)
Anion gap: 9 (ref 5–15)
BUN: 8 mg/dL (ref 6–20)
CO2: 22 mmol/L (ref 22–32)
Calcium: 10 mg/dL (ref 8.9–10.3)
Chloride: 106 mmol/L (ref 98–111)
Creatinine, Ser: 0.6 mg/dL (ref 0.44–1.00)
GFR, Estimated: >60 mL/min (ref >60)
Glucose, Bld: 119 mg/dL — ABNORMAL HIGH (ref 70–99)
Potassium: 3.4 mmol/L — ABNORMAL LOW (ref 3.5–5.1)
Sodium: 137 mmol/L (ref 135–145)
Total Bilirubin: 0.6 mg/dL (ref 0.0–1.2)
Total Protein: 7.7 g/dL (ref 6.5–8.1)

## 2023-04-28 LAB — URINALYSIS, ROUTINE W REFLEX MICROSCOPIC
Bilirubin Urine: NEGATIVE
Glucose, UA: NEGATIVE mg/dL
Ketones, ur: NEGATIVE mg/dL
Leukocytes,Ua: NEGATIVE
Nitrite: POSITIVE — AB
Protein, ur: 100 mg/dL — AB
RBC / HPF: >50 RBC/hpf (ref 0–5)
Specific Gravity, Urine: 1.026 (ref 1.005–1.030)
WBC, UA: >50 WBC/hpf (ref 0–5)
pH: 5 (ref 5.0–8.0)

## 2023-04-28 LAB — RESP PANEL BY RT-PCR (RSV, FLU A&B, COVID)  RVPGX2
Influenza A by PCR: NEGATIVE
Influenza B by PCR: NEGATIVE
Resp Syncytial Virus by PCR: NEGATIVE
SARS Coronavirus 2 by RT PCR: NEGATIVE

## 2023-04-28 LAB — CBC
HCT: 32.6 % — ABNORMAL LOW (ref 36.0–46.0)
Hemoglobin: 9.3 g/dL — ABNORMAL LOW (ref 12.0–15.0)
MCH: 21.3 pg — ABNORMAL LOW (ref 26.0–34.0)
MCHC: 28.5 g/dL — ABNORMAL LOW (ref 30.0–36.0)
MCV: 74.8 fL — ABNORMAL LOW (ref 80.0–100.0)
Platelets: 299 10*3/uL (ref 150–400)
RBC: 4.36 MIL/uL (ref 3.87–5.11)
RDW: 19.7 % — ABNORMAL HIGH (ref 11.5–15.5)
WBC: 7.2 10*3/uL (ref 4.0–10.5)
nRBC: 0 % (ref 0.0–0.2)

## 2023-04-28 LAB — HCG, SERUM, QUALITATIVE: Preg, Serum: NEGATIVE

## 2023-04-28 LAB — LIPASE, BLOOD: Lipase: 22 U/L (ref 11–51)

## 2023-04-28 MED ORDER — KETOROLAC TROMETHAMINE 15 MG/ML IJ SOLN
15.0000 mg | Freq: Once | INTRAMUSCULAR | Status: AC
  Administered 2023-04-28: 15 mg via INTRAMUSCULAR
  Filled 2023-04-28: qty 1

## 2023-04-28 MED ORDER — OXYCODONE-ACETAMINOPHEN 5-325 MG PO TABS
1.0000 | ORAL_TABLET | Freq: Once | ORAL | Status: AC
  Administered 2023-04-28: 1 via ORAL
  Filled 2023-04-28: qty 1

## 2023-04-28 MED ORDER — NAPROXEN 500 MG PO TABS: 500.0000 mg | ORAL_TABLET | Freq: Two times a day (BID) | ORAL | 0 refills | Status: AC

## 2023-04-28 MED ORDER — CEPHALEXIN 500 MG PO CAPS: 1000.0000 mg | ORAL_CAPSULE | Freq: Two times a day (BID) | ORAL | 0 refills | Status: AC

## 2023-04-28 NOTE — ED Triage Notes (Signed)
 Pt c/o left side flank pain x 2 weeks with urine freq and dysuria.  Pt reports cloudy vision x tonight unsure of what time, pt also reports elevated BP at home , denies pmh of same or anything else.  Pt denies sick contacts

## 2023-04-28 NOTE — ED Notes (Signed)
 Advised pt the need for a urine sample urine cup labeled and at bedside

## 2023-04-28 NOTE — ED Provider Notes (Signed)
 [place] EMERGENCY DEPARTMENT AT [facility] Provider Note   CSN: [id number] Arrival date & time: 04/27/23  2350     History Chief Complaint  Patient presents with   MULTIPLE MEDICAL COMPLAINTS     Virginia Ferguson is a 43 y.o. female.  Patient presents to the emergency department today with concerns of left-sided abdominal/flank pain.  Past history significant for kidney stones.  States that she has also had some increased urinary frequency and dysuria.  She also does remark some concerns for some vision changes this evening with a mild headache and some associated elevated blood pressure.  She denies take any medications for hypertension.  At this time, unclear if she has hematuria as she is currently on her menstrual cycle.  Denies any significant focal abdominal tenderness.  HPI     Home Medications Prior to Admission medications   Medication Sig Start Date End Date Taking? Authorizing Provider  acetaminophen (TYLENOL) 650 MG CR tablet Take 650 mg by mouth every 8 (eight) hours as needed for pain.   Yes [provider]  aspirin-acetaminophen-caffeine (EXCEDRIN MIGRAINE) [phone] MG tablet Take 1 tablet by mouth every 6 (six) hours as needed for headache.   Yes [provider]  cephALEXin (KEFLEX) 500 MG capsule Take 2 capsules (1,000 mg total) by mouth 2 (two) times daily for 10 days. 04/28/23 05/08/23 Yes Smitty Knudsen, PA-C  naproxen (NAPROSYN) 500 MG tablet Take 1 tablet (500 mg total) by mouth 2 (two) times daily for 10 days. 04/28/23 05/08/23 Yes Smitty Knudsen, PA-C      Allergies    Patient has no known allergies.    Review of Systems   Review of Systems  Genitourinary:  Positive for dysuria and frequency.  All other systems reviewed and are negative.   Physical Exam Updated Vital Signs BP (!) 152/99   Pulse 92   Temp 98.5 F (36.9 C) (Oral)   Resp 18   LMP 04/28/2023   SpO2 100%  Physical Exam Vitals and nursing note reviewed.   Constitutional:      General: She is not in acute distress.    Appearance: She is well-developed.  HENT:     Head: Normocephalic and atraumatic.  Eyes:     General: Lids are normal. No scleral icterus.       Right eye: No foreign body or discharge.        Left eye: No foreign body or discharge.     Extraocular Movements: Extraocular movements intact.     Right eye: Normal extraocular motion and no nystagmus.     Left eye: Normal extraocular motion and no nystagmus.     Conjunctiva/sclera: Conjunctivae normal.     Right eye: Right conjunctiva is not injected. No chemosis, exudate or hemorrhage.    Left eye: Left conjunctiva is not injected. No chemosis, exudate or hemorrhage.    Pupils: Pupils are equal, round, and reactive to light.  Cardiovascular:     Rate and Rhythm: Normal rate and regular rhythm.     Heart sounds: No murmur heard. Pulmonary:     Effort: Pulmonary effort is normal. No respiratory distress.     Breath sounds: Normal breath sounds. No wheezing or rales.  Abdominal:     General: Abdomen is flat. Bowel sounds are normal. There is no distension.     Palpations: Abdomen is soft.     Tenderness: There is no abdominal tenderness. There is left CVA tenderness. There is no  right CVA tenderness or guarding.  Musculoskeletal:        General: No swelling.     Cervical back: Neck supple.  Skin:    General: Skin is warm and dry.     Capillary Refill: Capillary refill takes less than 2 seconds.  Neurological:     Mental Status: She is alert.  Psychiatric:        Mood and Affect: Mood normal.     ED Results / Procedures / Treatments   Labs (all labs ordered are listed, but only abnormal results are displayed) Labs Reviewed  COMPREHENSIVE METABOLIC PANEL - Abnormal; Notable for the following components:      Result Value   Potassium 3.4 (*)    Glucose, Bld 119 (*)    Alkaline Phosphatase 138 (*)    All other components within normal limits  CBC - Abnormal;  Notable for the following components:   Hemoglobin 9.3 (*)    HCT 32.6 (*)    MCV 74.8 (*)    MCH 21.3 (*)    MCHC 28.5 (*)    RDW 19.7 (*)    All other components within normal limits  URINALYSIS, ROUTINE W REFLEX MICROSCOPIC - Abnormal; Notable for the following components:   Color, Urine RED (*)    APPearance CLOUDY (*)    Hgb urine dipstick LARGE (*)    Protein, ur 100 (*)    Nitrite POSITIVE (*)    Bacteria, UA MANY (*)    All other components within normal limits  RESP PANEL BY RT-PCR (RSV, FLU A&B, COVID)  [id number]  URINE CULTURE  LIPASE, BLOOD  HCG, SERUM, QUALITATIVE    EKG None  Radiology CT Head Wo Contrast Result Date: 04/28/2023 CLINICAL DATA:  Headache, neuro deficit, cloudy vision and high blood pressure EXAM: CT HEAD WITHOUT CONTRAST TECHNIQUE: Contiguous axial images were obtained from the base of the skull through the vertex without intravenous contrast. RADIATION DOSE REDUCTION: This exam was performed according to the departmental dose-optimization program which includes automated exposure control, adjustment of the mA and/or kV according to patient size and/or use of iterative reconstruction technique. COMPARISON:  None Available. FINDINGS: Brain: Mucosal thickening in the ethmoid air cells Vascular: No hyperdense vessel or unexpected calcification. Skull: No fracture or focal lesion. Sinuses/Orbits: .  Unremarkable orbits. Other: None. IMPRESSION: No acute intracranial abnormality. Electronically Signed   By: Minerva Fester M.D.   On: 04/28/2023 03:32   CT Renal Stone Study Result Date: 04/28/2023 CLINICAL DATA:  Left flank pain. EXAM: CT ABDOMEN AND PELVIS WITHOUT CONTRAST TECHNIQUE: Multidetector CT imaging of the abdomen and pelvis was performed following the standard protocol without IV contrast. RADIATION DOSE REDUCTION: This exam was performed according to the departmental dose-optimization program which includes automated exposure control, adjustment of the  mA and/or kV according to patient size and/or use of iterative reconstruction technique. COMPARISON:  December 23, 2019 FINDINGS: Lower chest: No acute abnormality. Hepatobiliary: No focal liver abnormality is seen. No gallstones, gallbladder wall thickening, or biliary dilatation. Pancreas: Unremarkable. No pancreatic ductal dilatation or surrounding inflammatory changes. Spleen: Normal in size without focal abnormality. Adrenals/Urinary Tract: Adrenal glands are unremarkable. Kidneys are normal, without renal calculi, focal lesion, or hydronephrosis. Bladder is unremarkable. Stomach/Bowel: Stomach is within normal limits. Appendix appears normal. No evidence of bowel wall thickening, distention, or inflammatory changes. Vascular/Lymphatic: No significant vascular findings are present. No enlarged abdominal or pelvic lymph nodes. Reproductive: Uterus and bilateral adnexa are unremarkable. Other: No abdominal wall hernia  or abnormality. No abdominopelvic ascites. Musculoskeletal: Mild to moderate severity degenerative changes and vacuum disc phenomenon are seen at the level of L4-L5. IMPRESSION: 1. No acute or active process within the abdomen or pelvis. Electronically Signed   By: Aram Candela M.D.   On: 04/28/2023 03:31    Procedures Procedures    Medications Ordered in ED Medications  oxyCODONE-acetaminophen (PERCOCET/ROXICET) 5-325 MG per tablet 1 tablet (1 tablet Oral Given 04/28/23 0228)  ketorolac (TORADOL) 15 MG/ML injection 15 mg (15 mg Intramuscular Given 04/28/23 0447)    ED Course/ Medical Decision Making/ A&P                                 Medical Decision Making Amount and/or Complexity of Data Reviewed Labs: ordered. Radiology: ordered.  Risk Prescription drug management.    This patient presents to the ED for concern of abdominal pain, vision changes.  Differential diagnosis includes bowel obstruction, diverticulitis, urolithiasis, SAH, migraine   Lab Tests:  I  Ordered, and personally interpreted labs.  The pertinent results include: CBC with baseline hemoglobin, CMP shows mild hypokalemia 3.4 but no evidence of AKI, respiratory panel negative, ECG negative, lipase normal, urinalysis concerning for infection with hemoglobin, nitrates, and many bacteria seen although some concern for contamination of urinalysis there are 11-20 squamous epithelial cells present   Imaging Studies ordered:  I ordered imaging studies including CT renal stone study, CT head I independently visualized and interpreted imaging which showed negative for any acute findings in the abdomen or in the head I agree with the radiologist interpretation   Medicines ordered and prescription drug management:  I ordered medication including Percocet, Toradol for pain Reevaluation of the patient after these medicines showed that the patient improved I have reviewed the patients home medicines and have made adjustments as needed   Problem List / ED Course:  Patient presents to the emergency department today with concerns of left-sided flank pain.  Reports has been ongoing for about 2 weeks with associated dysuria.  No clear hematuria but patient is currently on menstrual cycle so difficult to assess.  Denies any nausea, vomiting, diaphoresis.  No recent changes with bowel movements or habits.  She does also report that earlier this evening she had some blurring in her vision with concerns for a mild headache and elevated blood pressure.  She is not current on any blood pressure medications.  No prior history of stroke. Physical exam is reassuring with only mild left CVA tenderness.  No abdominal tenderness.  No acute focal neurological deficit.  Visual fields unremarkable and no obvious clouding present in bilateral eyes. Will obtain labs and imaging for assessment of current symptoms. Percocet ordered for pain control. Will reassess shortly. Lab workup is reassuring without any acute focal  findings seen.  Mild hypokalemia 3.4.  Urinalysis is consistent with concerns for infection.  There is some concern for contamination however with findings of nitrites and significant bacteria count, suspect this is likely pyelonephritis.  CT head and CT renal stone study are negative for any acute findings. On reassessment, patient does appear to have some improvement with her pain with the Percocet.  Will administer a dose of Toradol for further pain control.  Advised patient that urine will be sent off for culture for further assessment.  Given symptoms, I will initiate treatment with antibiotic therapy for suspected pyelonephritis.  Advised patient that she should take antibiotics as prescribed.  Return precautions discussed.  Denies any worsening of her headache and feels that her vision is returning close to baseline.  Unclear exact source of this and may be secondary to headache although patient has no significant migraine history.  Advised use of over-the-counter medications for headache as needed.  Patient otherwise stable at this time for outpatient follow-up and discharged home in stable condition.  Final Clinical Impression(s) / ED Diagnoses Final diagnoses:  Pyelonephritis  Hypokalemia  Acute nonintractable headache, unspecified headache type    Rx / DC Orders ED Discharge Orders          Ordered    cephALEXin (KEFLEX) 500 MG capsule  2 times daily        04/28/23 0429    naproxen (NAPROSYN) 500 MG tablet  2 times daily        04/28/23 0429              Smitty Knudsen, PA-C 04/28/23 0458    Nira Conn, MD 04/28/23 [phone]

## 2023-04-28 NOTE — Discharge Instructions (Signed)
 You are seen in the emergency department today for concerns of flank pain and a headache with vision disturbance.  Your labs were thankfully reassuring but your urine does appear to show signs of infection.  There is no kidney stone or other concerning findings on your imaging.  I do suspect that your headache and your visual disturbance are likely related.  This may be all secondary to your current pain from your kidney infection.  For this infection, I have sent a prescription for antibiotics that you will be taking for the next 10 days.  Please take this medication as prescribed.  Please follow-up with a primary care provider or return to the emergency department for any concerns or new or worsening symptoms.

## 2023-04-30 LAB — URINE CULTURE
Culture: >=100000 — AB
Special Requests: NORMAL

## 2023-05-01 ENCOUNTER — Telehealth (HOSPITAL_BASED_OUTPATIENT_CLINIC_OR_DEPARTMENT_OTHER): Payer: Self-pay | Admitting: *Deleted

## 2023-05-01 NOTE — Telephone Encounter (Signed)
 Post ED Visit - Positive Culture Follow-up  Culture report reviewed by antimicrobial stewardship pharmacist: [facility] []  [address] Pharm.D. []  Celedonio Miyamoto, Pharm.D., BCPS AQ-ID []  Garvin Fila, Pharm.D., BCPS []  Georgina Pillion, [address].D., BCPS []  [place], [address].D., BCPS, AAHIVP []  Estella Husk, Pharm.D., BCPS, AAHIVP []  Lysle Pearl, PharmD, BCPS []  Phillips Climes, PharmD, BCPS []  Agapito Games, PharmD, BCPS []  Verlan Friends, PharmD []  Mervyn [name], PharmD, BCPS []  Vinnie Level, PharmD  [facility] []  Len Childs, PharmD []  Greer Pickerel, PharmD []  Adalberto Cole, PharmD []  Perlie Gold, Rph []  Lonell Face) Jean Rosenthal, PharmD []  Earl Many, PharmD []  Junita Push, PharmD []  Dorna Leitz, PharmD []  Terrilee Files, PharmD []  Lynann Beaver, PharmD []  Keturah Barre, PharmD []  Loralee Pacas, PharmD [x]  Bernadene Person, PharmD   Positive urine culture Treated with Cephalexin, organism sensitive to the same and no further patient follow-up is required at this time.  Virginia Ferguson 05/01/2023, 10:08 AM

## 2023-08-24 ENCOUNTER — Encounter (HOSPITAL_COMMUNITY): Payer: Self-pay | Admitting: Emergency Medicine

## 2023-08-24 ENCOUNTER — Emergency Department (HOSPITAL_COMMUNITY)
Admission: EM | Admit: 2023-08-24 | Discharge: 2023-08-25 | Disposition: A | Discharge status: Home / Self Care | Attending: Emergency Medicine | Admitting: Emergency Medicine

## 2023-08-24 ENCOUNTER — Other Ambulatory Visit: Payer: Self-pay

## 2023-08-24 DIAGNOSIS — D649 Anemia, unspecified: Secondary | ICD-10-CM | POA: Insufficient documentation

## 2023-08-24 DIAGNOSIS — R109 Unspecified abdominal pain: Secondary | ICD-10-CM | POA: Insufficient documentation

## 2023-08-24 DIAGNOSIS — M546 Pain in thoracic spine: Secondary | ICD-10-CM | POA: Diagnosis present

## 2023-08-24 DIAGNOSIS — Z87442 Personal history of urinary calculi: Secondary | ICD-10-CM | POA: Diagnosis not present

## 2023-08-24 NOTE — ED Triage Notes (Signed)
 Patient reports left flank and back pain for 4-5 days. Hx of kidney stones. Reports increased in frequency of urination. Denies fevers at home. Denies any recent injury to back.

## 2023-08-25 ENCOUNTER — Emergency Department (HOSPITAL_COMMUNITY)

## 2023-08-25 ENCOUNTER — Telehealth (HOSPITAL_COMMUNITY): Payer: Self-pay | Admitting: Emergency Medicine

## 2023-08-25 LAB — URINALYSIS, ROUTINE W REFLEX MICROSCOPIC
Bilirubin Urine: NEGATIVE
Glucose, UA: NEGATIVE mg/dL
Ketones, ur: NEGATIVE mg/dL
Leukocytes,Ua: NEGATIVE
Nitrite: NEGATIVE
Protein, ur: 30 mg/dL — AB
Specific Gravity, Urine: 1.026 (ref 1.005–1.030)
pH: 5 (ref 5.0–8.0)

## 2023-08-25 LAB — BASIC METABOLIC PANEL WITH GFR
Anion gap: 7 (ref 5–15)
BUN: 7 mg/dL (ref 6–20)
CO2: 21 mmol/L — ABNORMAL LOW (ref 22–32)
Calcium: 10.7 mg/dL — ABNORMAL HIGH (ref 8.9–10.3)
Chloride: 109 mmol/L (ref 98–111)
Creatinine, Ser: 0.58 mg/dL (ref 0.44–1.00)
GFR, Estimated: >60 mL/min (ref >60)
Glucose, Bld: 107 mg/dL — ABNORMAL HIGH (ref 70–99)
Potassium: 4 mmol/L (ref 3.5–5.1)
Sodium: 137 mmol/L (ref 135–145)

## 2023-08-25 LAB — CBC
HCT: 31.6 % — ABNORMAL LOW (ref 36.0–46.0)
Hemoglobin: 9 g/dL — ABNORMAL LOW (ref 12.0–15.0)
MCH: 20.1 pg — ABNORMAL LOW (ref 26.0–34.0)
MCHC: 28.5 g/dL — ABNORMAL LOW (ref 30.0–36.0)
MCV: 70.5 fL — ABNORMAL LOW (ref 80.0–100.0)
Platelets: 316 K/uL (ref 150–400)
RBC: 4.48 MIL/uL (ref 3.87–5.11)
RDW: 20.8 % — ABNORMAL HIGH (ref 11.5–15.5)
WBC: 8.1 K/uL (ref 4.0–10.5)
nRBC: 0 % (ref 0.0–0.2)

## 2023-08-25 LAB — HCG, SERUM, QUALITATIVE: Preg, Serum: NEGATIVE

## 2023-08-25 LAB — D-DIMER, QUANTITATIVE: D-Dimer, Quant: <0.27 ug{FEU}/mL (ref 0.00–0.50)

## 2023-08-25 MED ORDER — OXYCODONE-ACETAMINOPHEN 5-325 MG PO TABS
1.0000 | ORAL_TABLET | ORAL | Status: DC | PRN
  Administered 2023-08-25: 1 via ORAL
  Filled 2023-08-25: qty 1

## 2023-08-25 MED ORDER — DIAZEPAM 5 MG PO TABS
5.0000 mg | ORAL_TABLET | Freq: Once | ORAL | Status: AC
  Administered 2023-08-25: 5 mg via ORAL
  Filled 2023-08-25: qty 1

## 2023-08-25 MED ORDER — DIAZEPAM 5 MG PO TABS: 5.0000 mg | ORAL_TABLET | Freq: Two times a day (BID) | ORAL | 0 refills | Status: AC

## 2023-08-25 MED ORDER — KETOROLAC TROMETHAMINE 30 MG/ML IJ SOLN
30.0000 mg | Freq: Once | INTRAMUSCULAR | Status: AC
  Administered 2023-08-25: 30 mg via INTRAMUSCULAR
  Filled 2023-08-25: qty 1

## 2023-08-25 MED ORDER — OXYCODONE-ACETAMINOPHEN 5-325 MG PO TABS: 1.0000 | ORAL_TABLET | Freq: Four times a day (QID) | ORAL | 0 refills | Status: AC | PRN

## 2023-08-25 NOTE — ED Notes (Signed)
 Patient Alert and oriented to baseline. Stable and ambulatory to baseline. Patient verbalized understanding of the discharge instructions.  Patient belongings were taken by the patient.

## 2023-08-25 NOTE — Discharge Instructions (Addendum)
 Continue to take the ibuprofen  for pain.

## 2023-08-25 NOTE — ED Provider Notes (Signed)
 [place] EMERGENCY DEPARTMENT AT [facility] Provider Note   CSN: [id number] Arrival date & time: 08/24/23  2341     Patient presents with: Back Pain and Flank Pain   Virginia Ferguson is a 43 y.o. female.   Pt is a 43 yo female with pmhx significant for kidney stones.  Pt has had left flank and back pain for 4-5 days with increased urinary frequency.  No fevers.  No n/v.  She has been seeing ortho for chronic right sided back pain.  Last visit was on 7/8.  Surgery has been recommended, but she needs to lose more weight before they will do surgery.  However, this is new pain and is on the left and it's more in her upper back.  Pain is worse with movement.  No known injury.       Prior to Admission medications   Medication Sig Start Date End Date Taking? Authorizing Provider  diazepam  (VALIUM ) 5 MG tablet Take 1 tablet (5 mg total) by mouth 2 (two) times daily. 08/25/23  Yes Dean Clarity, MD  oxyCODONE -acetaminophen  (PERCOCET/ROXICET) 5-325 MG tablet Take 1 tablet by mouth every 6 (six) hours as needed for severe pain (pain score 7-10). 08/25/23  Yes Dean Clarity, MD  acetaminophen  (TYLENOL ) 650 MG CR tablet Take 650 mg by mouth every 8 (eight) hours as needed for pain.    [provider]  aspirin-acetaminophen -caffeine (EXCEDRIN MIGRAINE) 250-250-65 MG tablet Take 1 tablet by mouth every 6 (six) hours as needed for headache.    [provider]    Allergies: Patient has no known allergies.    Review of Systems  Genitourinary:  Positive for frequency.  Musculoskeletal:  Positive for back pain.  All other systems reviewed and are negative.   Updated Vital Signs BP 135/89   Pulse 91   Temp 98.8 F (37.1 C)   Resp 18   Ht 5' 4 (1.626 m)   Wt (!) 141.1 kg   SpO2 99%   BMI 53.38 kg/m   Physical Exam Vitals and nursing note reviewed.  Constitutional:      Appearance: Normal appearance. She is obese.  HENT:     Head: Normocephalic and  atraumatic.     Right Ear: External ear normal.     Left Ear: External ear normal.     Nose: Nose normal.     Mouth/Throat:     Mouth: Mucous membranes are moist.     Pharynx: Oropharynx is clear.  Eyes:     Extraocular Movements: Extraocular movements intact.     Conjunctiva/sclera: Conjunctivae normal.     Pupils: Pupils are equal, round, and reactive to light.  Cardiovascular:     Rate and Rhythm: Normal rate and regular rhythm.     Pulses: Normal pulses.     Heart sounds: Normal heart sounds.  Pulmonary:     Effort: Pulmonary effort is normal.     Breath sounds: Normal breath sounds.  Abdominal:     General: Abdomen is flat. Bowel sounds are normal.     Palpations: Abdomen is soft.  Musculoskeletal:       Arms:     Cervical back: Normal range of motion and neck supple.  Skin:    General: Skin is warm.     Capillary Refill: Capillary refill takes less than 2 seconds.  Neurological:     General: No focal deficit present.     Mental Status: She is alert.  Psychiatric:  Mood and Affect: Mood normal.     (all labs ordered are listed, but only abnormal results are displayed) Labs Reviewed  URINALYSIS, ROUTINE W REFLEX MICROSCOPIC - Abnormal; Notable for the following components:      Result Value   Color, Urine AMBER (*)    APPearance CLOUDY (*)    Hgb urine dipstick MODERATE (*)    Protein, ur 30 (*)    Bacteria, UA MANY (*)    All other components within normal limits  BASIC METABOLIC PANEL WITH GFR - Abnormal; Notable for the following components:   CO2 21 (*)    Glucose, Bld 107 (*)    Calcium 10.7 (*)    All other components within normal limits  CBC - Abnormal; Notable for the following components:   Hemoglobin 9.0 (*)    HCT 31.6 (*)    MCV 70.5 (*)    MCH 20.1 (*)    MCHC 28.5 (*)    RDW 20.8 (*)    All other components within normal limits  HCG, SERUM, QUALITATIVE  D-DIMER, QUANTITATIVE    EKG: None  Radiology: CT Renal Stone  Study Result Date: 08/25/2023 CLINICAL DATA:  Left flank pain for several days EXAM: CT ABDOMEN AND PELVIS WITHOUT CONTRAST TECHNIQUE: Multidetector CT imaging of the abdomen and pelvis was performed following the standard protocol without IV contrast. RADIATION DOSE REDUCTION: This exam was performed according to the departmental dose-optimization program which includes automated exposure control, adjustment of the mA and/or kV according to patient size and/or use of iterative reconstruction technique. COMPARISON:  04/28/2023 FINDINGS: Lower chest: No acute abnormality. Hepatobiliary: No focal liver abnormality is seen. No gallstones, gallbladder wall thickening, or biliary dilatation. Pancreas: Unremarkable. No pancreatic ductal dilatation or surrounding inflammatory changes. Spleen: Normal in size without focal abnormality. Adrenals/Urinary Tract: Adrenal glands are within normal limits. Kidneys are well visualized bilaterally. No renal calculi or obstructive changes are seen. The ureters are within normal limits. Bladder is decompressed. Stomach/Bowel: No obstructive or inflammatory changes of the colon are noted. The appendix is within normal limits. Small bowel and stomach are unremarkable. Vascular/Lymphatic: No significant vascular findings are present. No enlarged abdominal or pelvic lymph nodes. Reproductive: Uterus and bilateral adnexa are unremarkable. Other: No abdominal wall hernia or abnormality. No abdominopelvic ascites. Musculoskeletal: No acute or significant osseous findings. IMPRESSION: No acute abnormality noted. Electronically Signed   By: Oneil Devonshire M.D.   On: 08/25/2023 03:05     Procedures   Medications Ordered in the ED  oxyCODONE -acetaminophen  (PERCOCET/ROXICET) 5-325 MG per tablet 1 tablet (1 tablet Oral Given 08/25/23 0033)  diazepam  (VALIUM ) tablet 5 mg (5 mg Oral Given 08/25/23 0909)  ketorolac  (TORADOL ) 30 MG/ML injection 30 mg (30 mg Intramuscular Given 08/25/23 0909)                                     Medical Decision Making Amount and/or Complexity of Data Reviewed Labs: ordered.  Risk Prescription drug management.   This patient presents to the ED for concern of back pain, this involves an extensive number of treatment options, and is a complaint that carries with it a high risk of complications and morbidity.  The differential diagnosis includes kidney stones, pyelo, uti, msk, shingles   Co morbidities that complicate the patient evaluation  Kidney stones   Additional history obtained:  Additional history obtained from epic chart review  Lab Tests:  I Ordered, and personally interpreted labs.  The pertinent results include:  cbc with hgb low at 9 (stable); bmp nl; preg neg; ua contaminated, but with hgb (pt is finishing her period), no uti; ddimer neg   Imaging Studies ordered:  I ordered imaging studies including ct renal  I independently visualized and interpreted imaging which showed No acute abnormality noted.  I agree with the radiologist interpretation   Medicines ordered and prescription drug management:  I ordered medication including percocet  for pain  Reevaluation of the patient after these medicines showed that the patient improved I have reviewed the patients home medicines and have made adjustments as needed   Test Considered:  ct   Critical Interventions:  Pain control   Problem List / ED Course:  Left sided flank pain:  etiology is likely muscle spasm.  She has no neurologic sx.  No findings on ct.  Ddimer neg.  Ua neg.  Pt is stable for d/c.  Return if worse. F/u with pcp/ortho.   Reevaluation:  After the interventions noted above, I reevaluated the patient and found that they have :improved   Social Determinants of Health:  Lives at home   Dispostion:  After consideration of the diagnostic results and the patients response to treatment, I feel that the patent would benefit from discharge  with outpatient f/u.       Final diagnoses:  Flank pain  Chronic anemia    ED Discharge Orders          Ordered    oxyCODONE -acetaminophen  (PERCOCET/ROXICET) 5-325 MG tablet  Every 6 hours PRN       Note to Pharmacy: ICD CODE: S33.5   08/25/23 0954    diazepam  (VALIUM ) 5 MG tablet  2 times daily       Note to Pharmacy: ICD CODE:  S33.5   08/25/23 9045               Dean Clarity, MD 08/25/23 [phone]

## 2023-11-03 ENCOUNTER — Other Ambulatory Visit: Payer: Self-pay

## 2023-11-03 ENCOUNTER — Encounter (HOSPITAL_BASED_OUTPATIENT_CLINIC_OR_DEPARTMENT_OTHER): Payer: Self-pay

## 2023-11-03 ENCOUNTER — Emergency Department (HOSPITAL_BASED_OUTPATIENT_CLINIC_OR_DEPARTMENT_OTHER)
Admission: EM | Admit: 2023-11-03 | Discharge: 2023-11-03 | Discharge status: Against Medical Advice | Attending: Emergency Medicine | Admitting: Emergency Medicine

## 2023-11-03 DIAGNOSIS — Y92410 Unspecified street and highway as the place of occurrence of the external cause: Secondary | ICD-10-CM | POA: Insufficient documentation

## 2023-11-03 DIAGNOSIS — M545 Low back pain, unspecified: Secondary | ICD-10-CM | POA: Diagnosis present

## 2023-11-03 DIAGNOSIS — Z5321 Procedure and treatment not carried out due to patient leaving prior to being seen by health care provider: Secondary | ICD-10-CM | POA: Insufficient documentation
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
## 2023-11-03 NOTE — ED Notes (Signed)
 Called for pt to get urine sample-no answer

## 2023-11-04 ENCOUNTER — Ambulatory Visit (HOSPITAL_COMMUNITY): Payer: Self-pay

## 2023-11-04 ENCOUNTER — Encounter (HOSPITAL_COMMUNITY): Payer: Self-pay | Admitting: Emergency Medicine

## 2023-11-04 ENCOUNTER — Ambulatory Visit (HOSPITAL_COMMUNITY)
Admission: EM | Admit: 2023-11-04 | Discharge: 2023-11-04 | Disposition: A | Discharge status: Home / Self Care | Attending: Family Medicine | Admitting: Family Medicine

## 2023-11-04 DIAGNOSIS — M25512 Pain in left shoulder: Secondary | ICD-10-CM | POA: Diagnosis not present

## 2023-11-04 DIAGNOSIS — M545 Low back pain, unspecified: Secondary | ICD-10-CM | POA: Diagnosis not present

## 2023-11-04 MED ORDER — TIZANIDINE HCL 4 MG PO TABS: 4.0000 mg | ORAL_TABLET | Freq: Three times a day (TID) | ORAL | 0 refills | Status: AC | PRN

## 2023-11-04 MED ORDER — IBUPROFEN 600 MG PO TABS: 600.0000 mg | ORAL_TABLET | Freq: Four times a day (QID) | ORAL | 0 refills | Status: AC | PRN

## 2023-11-04 MED ORDER — KETOROLAC TROMETHAMINE 30 MG/ML IJ SOLN
30.0000 mg | Freq: Once | INTRAMUSCULAR | Status: AC
  Administered 2023-11-04: 30 mg via INTRAMUSCULAR

## 2023-11-04 MED ORDER — KETOROLAC TROMETHAMINE 30 MG/ML IJ SOLN
INTRAMUSCULAR | Status: AC
  Filled 2023-11-04: qty 1
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
